# Patient Record
Sex: Female | Born: 1945 | Race: White | Hispanic: No | Marital: Married | Smoking: Former smoker
Health system: Southern US, Community
[De-identification: ages and names within clinical notes are randomized; demographics above are authoritative.]

## PROBLEM LIST (undated history)

## (undated) DIAGNOSIS — I739 Peripheral vascular disease, unspecified: Secondary | ICD-10-CM

## (undated) DIAGNOSIS — R001 Bradycardia, unspecified: Secondary | ICD-10-CM

## (undated) DIAGNOSIS — R9431 Abnormal electrocardiogram [ECG] [EKG]: Secondary | ICD-10-CM

## (undated) DIAGNOSIS — E119 Type 2 diabetes mellitus without complications: Secondary | ICD-10-CM

## (undated) DIAGNOSIS — R0789 Other chest pain: Secondary | ICD-10-CM

## (undated) DIAGNOSIS — R5383 Other fatigue: Secondary | ICD-10-CM

## (undated) DIAGNOSIS — R0602 Shortness of breath: Secondary | ICD-10-CM

## (undated) DIAGNOSIS — R42 Dizziness and giddiness: Secondary | ICD-10-CM

## (undated) DIAGNOSIS — I10 Essential (primary) hypertension: Secondary | ICD-10-CM

## (undated) HISTORY — DX: Essential (primary) hypertension: I10

## (undated) HISTORY — DX: Other fatigue: R53.83

## (undated) HISTORY — PX: BREAST SURGERY: SHX581

## (undated) HISTORY — DX: Shortness of breath: R06.02

## (undated) HISTORY — DX: Abnormal electrocardiogram (ECG) (EKG): R94.31

## (undated) HISTORY — DX: Bradycardia, unspecified: R00.1

## (undated) HISTORY — PX: OVARY SURGERY: SHX727

## (undated) HISTORY — DX: Dizziness and giddiness: R42

## (undated) HISTORY — DX: Type 2 diabetes mellitus without complications: E11.9

## (undated) HISTORY — DX: Other chest pain: R07.89

## (undated) HISTORY — DX: Peripheral vascular disease, unspecified: I73.9

## (undated) HISTORY — PX: MELANOMA EXCISION: SHX5266

---

## 2011-11-01 ENCOUNTER — Ambulatory Visit (INDEPENDENT_AMBULATORY_CARE_PROVIDER_SITE_OTHER): Payer: Medicare Other | Admitting: Cardiology

## 2011-11-01 ENCOUNTER — Encounter: Payer: Self-pay | Admitting: Cardiology

## 2011-11-01 VITALS — BP 132/82 | HR 75 | Ht 66.0 in | Wt 153.0 lb

## 2011-11-01 DIAGNOSIS — I10 Essential (primary) hypertension: Secondary | ICD-10-CM | POA: Insufficient documentation

## 2011-11-01 DIAGNOSIS — E119 Type 2 diabetes mellitus without complications: Secondary | ICD-10-CM | POA: Insufficient documentation

## 2011-11-01 DIAGNOSIS — R42 Dizziness and giddiness: Secondary | ICD-10-CM | POA: Insufficient documentation

## 2011-11-01 DIAGNOSIS — I739 Peripheral vascular disease, unspecified: Secondary | ICD-10-CM

## 2011-11-01 DIAGNOSIS — R9431 Abnormal electrocardiogram [ECG] [EKG]: Secondary | ICD-10-CM

## 2011-11-01 DIAGNOSIS — E785 Hyperlipidemia, unspecified: Secondary | ICD-10-CM

## 2011-11-01 DIAGNOSIS — R0600 Dyspnea, unspecified: Secondary | ICD-10-CM

## 2011-11-01 DIAGNOSIS — R0609 Other forms of dyspnea: Secondary | ICD-10-CM

## 2011-11-01 DIAGNOSIS — R079 Chest pain, unspecified: Secondary | ICD-10-CM

## 2011-11-01 NOTE — Assessment & Plan Note (Signed)
Blood pressure is well controlled even with reduction in her medication. We will continue same therapy.

## 2011-11-01 NOTE — Assessment & Plan Note (Signed)
Patient did try Crestor 2 months ago but quit taking it because it made her feel sick. If she does have evidence of vascular disease either on stress testing or by lower extremity Doppler we may need to reconsider lipid-lowering therapy.

## 2011-11-01 NOTE — Assessment & Plan Note (Signed)
Her symptoms of chest pain are somewhat atypical. Some of her symptoms are more consistent with musculoskeletal pain. She does however have significant cardiac risk factors including diabetes, hypertension, hyperlipidemia and family history of vascular disease. Along with her symptoms of dyspnea have recommended further evaluation with a Lexiscan Myoview study and echocardiogram.

## 2011-11-01 NOTE — Assessment & Plan Note (Signed)
QT interval has improved with reduction in her bradycardia.

## 2011-11-01 NOTE — Patient Instructions (Signed)
We will schedule you for an echocardiogram and a nuclear stress test.  We will arrange doppler testing of your legs to check your circulation.  Continue your current medication

## 2011-11-01 NOTE — Progress Notes (Signed)
Ariana Adams Date of Birth: Mar 13, 1946 Medical Record #[id number]  History of Present Illness: Ariana Adams is seen at the request of Dr. Isabella Stalling for evaluation of dizziness and chest pain. She is a pleasant 66 year old [place] female who has a history of diabetes mellitus type 2, hypertension, and dyslipidemia. She reports that over the past 3 weeks she has been experiencing symptoms of dizziness. She describes this as a sensation that the room is spinning around. It is worse when she lies down in bed. She was noted to have a significant bradycardia and her metoprolol and diltiazem doses were reduced. This has helped a little bit but she continues to have significant dizziness symptoms. In addition she feels weak. She has difficulty catching her breath at times. She complains of chest pain in her mid sternum. This pain hurts with pressure but also may occur with exertion. Her activity is limited by claudication symptoms. She has bilateral calf pain with exertion. She has been on Trental but states she has never had any formal evaluation of her circulation. She did quit smoking in 2009. She denies any prior cardiac history. He has had no prior stress testing or cardiac catheterization. She does have a strong family history of early vascular disease with multiple relatives having strokes.  Current Outpatient Prescriptions on File Prior to Visit  Medication Sig Dispense Refill  . cloNIDine (CATAPRES) 0.3 MG tablet Take 0.3 mg by mouth 2 (two) times daily.       Marland Kitchen diltiazem (CARDIZEM CD) 240 MG 24 hr capsule Take 240 mg by mouth daily.       Marland Kitchen glimepiride (AMARYL) 2 MG tablet Take 2 mg by mouth daily before breakfast.       . hydrochlorothiazide (HYDRODIURIL) 50 MG tablet Take 50 mg by mouth daily.       . metFORMIN (GLUCOPHAGE) 1000 MG tablet Take 1,000 mg by mouth 2 (two) times daily with a meal.       . metoprolol (LOPRESSOR) 100 MG tablet Take 25 mg by mouth Daily.      . metoprolol succinate  (TOPROL-XL) 25 MG 24 hr tablet Take 25 mg by mouth daily.         No Known Allergies  Past Medical History  Diagnosis Date  . Diabetes mellitus   . Dizziness   . Fatigue   . SOB (shortness of breath)     Mild  . Chest pressure   . Bradycardia   . Prolonged QT interval   . Claudication   . HTN (hypertension)     Past Surgical History  Procedure Date  . Ovary surgery   . Melanoma excision     Leg  . Breast surgery     History  Smoking status  . Former Smoker  Smokeless tobacco  . Not on file    History  Alcohol Use No    Family History  Problem Relation Age of Onset  . Stroke Mother   . Cancer Sister   . Stroke Brother     Review of Systems: The review of systems is positive for dizziness. Complaints of chest pain and shortness of breath as noted. Bilateral calf claudication. No history of TIA or stroke. She denies any nausea or vomiting. She has had no syncopal episodes. All other systems were reviewed and are negative.  Physical Exam: BP 132/82  Pulse 75  Ht 5\' 6"  (1.676 m)  Wt 153 lb (69.4 kg)  BMI 24.69 kg/m2  SpO2 97% She is  a pleasant white female in no acute distress. She is normocephalic, atraumatic. Pupils are equal round and reactive to light and accommodation. Sclera are clear. She has no nystagmus. Oropharynx is clear. Neck is supple without JVD, adenopathy, thyromegaly, or bruits. Lungs are clear. Cardiac exam reveals a regular rate and rhythm without gallop, murmur, or click. Abdomen is soft and nontender. There are no masses or bruits. Femoral, popliteal, and dorsalis pedis pulses are 2+ and symmetric. Posterior tibial pulses are difficult to palpate. Skin is warm and dry. She is alert and oriented x3. Cranial nerves II through XII are intact. Gait is normal. LABORATORY DATA: ECG dated 6 10/08/2011 shows marked sinus bradycardia with a rate of 44 beats per minute. QT corrected is 486 ms. There is nonspecific T-wave abnormality. ECG today  demonstrates normal sinus rhythm with a rate of 63 beats per minute. QT corrected is 472 ms. She has nonspecific T-wave abnormality. Laboratory data from 10/08/2011 shows a normal chemistry panel. BNP level was 51. A1c 6.9. TSH 1.765. D-dimer was 0.4. Total cholesterol 204, triglycerides 240, HDL 48, LDL 108.  Assessment / Plan:

## 2011-11-01 NOTE — Assessment & Plan Note (Signed)
Her dizziness as more of a character of true vertigo. She was significantly bradycardic previously but this has resolved with reduction in her metoprolol and diltiazem doses. Despite this her symptoms persist. I suspect this represents an inner ear disturbance or could possibly be related to the posterior circulation issue. She has no carotid bruits. We will await her other cardiac evaluation. If unremarkable she may need ENT evaluation and possible physical therapy for benign positional vertigo.

## 2011-11-01 NOTE — Assessment & Plan Note (Signed)
She does have bilateral calf claudication. She has multiple risk factors for PAD. Pulses are surprisingly good. We will obtain Ariana Adams arterial Doppler studies to further evaluate.

## 2011-11-05 ENCOUNTER — Encounter: Payer: Self-pay | Admitting: Cardiology

## 2011-11-06 ENCOUNTER — Encounter (INDEPENDENT_AMBULATORY_CARE_PROVIDER_SITE_OTHER): Payer: Medicare Other

## 2011-11-06 DIAGNOSIS — R079 Chest pain, unspecified: Secondary | ICD-10-CM

## 2011-11-06 DIAGNOSIS — I70219 Atherosclerosis of native arteries of extremities with intermittent claudication, unspecified extremity: Secondary | ICD-10-CM

## 2011-11-06 DIAGNOSIS — E119 Type 2 diabetes mellitus without complications: Secondary | ICD-10-CM

## 2011-11-06 DIAGNOSIS — R42 Dizziness and giddiness: Secondary | ICD-10-CM

## 2011-11-06 DIAGNOSIS — I739 Peripheral vascular disease, unspecified: Secondary | ICD-10-CM

## 2011-11-07 ENCOUNTER — Ambulatory Visit (HOSPITAL_COMMUNITY): Payer: Medicare Other | Attending: Cardiology

## 2011-11-07 ENCOUNTER — Telehealth: Payer: Self-pay

## 2011-11-07 DIAGNOSIS — I739 Peripheral vascular disease, unspecified: Secondary | ICD-10-CM

## 2011-11-07 DIAGNOSIS — R5383 Other fatigue: Secondary | ICD-10-CM | POA: Insufficient documentation

## 2011-11-07 DIAGNOSIS — E785 Hyperlipidemia, unspecified: Secondary | ICD-10-CM | POA: Insufficient documentation

## 2011-11-07 DIAGNOSIS — R079 Chest pain, unspecified: Secondary | ICD-10-CM

## 2011-11-07 DIAGNOSIS — I517 Cardiomegaly: Secondary | ICD-10-CM | POA: Insufficient documentation

## 2011-11-07 DIAGNOSIS — R5381 Other malaise: Secondary | ICD-10-CM | POA: Insufficient documentation

## 2011-11-07 DIAGNOSIS — I498 Other specified cardiac arrhythmias: Secondary | ICD-10-CM | POA: Insufficient documentation

## 2011-11-07 DIAGNOSIS — R42 Dizziness and giddiness: Secondary | ICD-10-CM | POA: Insufficient documentation

## 2011-11-07 DIAGNOSIS — R0989 Other specified symptoms and signs involving the circulatory and respiratory systems: Secondary | ICD-10-CM | POA: Insufficient documentation

## 2011-11-07 DIAGNOSIS — R072 Precordial pain: Secondary | ICD-10-CM | POA: Insufficient documentation

## 2011-11-07 DIAGNOSIS — R0609 Other forms of dyspnea: Secondary | ICD-10-CM | POA: Insufficient documentation

## 2011-11-07 DIAGNOSIS — I10 Essential (primary) hypertension: Secondary | ICD-10-CM | POA: Insufficient documentation

## 2011-11-07 DIAGNOSIS — E119 Type 2 diabetes mellitus without complications: Secondary | ICD-10-CM | POA: Insufficient documentation

## 2011-11-07 DIAGNOSIS — Z87891 Personal history of nicotine dependence: Secondary | ICD-10-CM | POA: Insufficient documentation

## 2011-11-07 NOTE — Telephone Encounter (Signed)
Patient was called to clarify metoprolol.Metoprolol was entered twice in her chart.Patient states she takes metoprolol succinate 25 mg daily.

## 2011-11-07 NOTE — Progress Notes (Signed)
Echocardiogram performed.  

## 2011-11-13 ENCOUNTER — Ambulatory Visit (HOSPITAL_COMMUNITY): Payer: Medicare Other | Attending: Cardiovascular Disease | Admitting: Radiology

## 2011-11-13 VITALS — BP 148/77 | Ht 66.0 in | Wt 153.0 lb

## 2011-11-13 DIAGNOSIS — R42 Dizziness and giddiness: Secondary | ICD-10-CM | POA: Insufficient documentation

## 2011-11-13 DIAGNOSIS — I739 Peripheral vascular disease, unspecified: Secondary | ICD-10-CM

## 2011-11-13 DIAGNOSIS — E119 Type 2 diabetes mellitus without complications: Secondary | ICD-10-CM | POA: Insufficient documentation

## 2011-11-13 DIAGNOSIS — R0602 Shortness of breath: Secondary | ICD-10-CM

## 2011-11-13 DIAGNOSIS — R079 Chest pain, unspecified: Secondary | ICD-10-CM | POA: Insufficient documentation

## 2011-11-13 MED ORDER — TECHNETIUM TC 99M TETROFOSMIN IV KIT
11.0000 | PACK | Freq: Once | INTRAVENOUS | Status: AC | PRN
  Administered 2011-11-13: 11 via INTRAVENOUS

## 2011-11-13 MED ORDER — REGADENOSON 0.4 MG/5ML IV SOLN
0.4000 mg | Freq: Once | INTRAVENOUS | Status: AC
  Administered 2011-11-13: 0.4 mg via INTRAVENOUS

## 2011-11-13 MED ORDER — TECHNETIUM TC 99M TETROFOSMIN IV KIT
33.0000 | PACK | Freq: Once | INTRAVENOUS | Status: AC | PRN
  Administered 2011-11-13: 33 via INTRAVENOUS

## 2011-11-13 NOTE — Progress Notes (Signed)
[facility] SITE 3 NUCLEAR MED [address] [place] [address] [phone]  Cardiology Nuclear Med Study  Ariana Adams is a 66 y.o. female     MRN : [id number]     DOB: 17-Jan-1946  Procedure Date: 11/13/2011  Nuclear Med Background Indication for Stress Test:  Evaluation for Ischemia History:  n/a Cardiac Risk Factors: Claudication, History of Smoking, Hypertension, Lipids and NIDDM  Symptoms:  Chest Pain, Dizziness, Fatigue and SOB   Nuclear Pre-Procedure Caffeine/Decaff Intake:  None NPO After: 7:30am   Lungs:  clear O2 Sat: 97% on room air. IV 0.9% NS with Angio Cath:  20g  IV Site: R Antecubital  IV Started by:  Stanton Kidney, EMT-P  Chest Size (in):  36 Cup Size: C  Height: 5\' 6"  (1.676 m)  Weight:  153 lb (69.4 kg)  BMI:  Body mass index is 24.69 kg/(m^2). Tech Comments:  Metoprolol held this am, per patient.    Nuclear Med Study 1 or 2 day study: 1 day  Stress Test Type:  Eugenie Birks  Reading MD: Kristeen Miss, MD  Order Authorizing Provider:  P.Jordan MD  Resting Radionuclide: Technetium 86m Tetrofosmin  Resting Radionuclide Dose: 11.0 mCi   Stress Radionuclide:  Technetium 70m Tetrofosmin  Stress Radionuclide Dose: 33.0 mCi           Stress Protocol Rest HR: 64 Stress HR: 108  Rest BP: 148/77 Stress BP: 181/81  Exercise Time (min): n/a METS: n/a   Predicted Max HR: 153 bpm % Max HR: 70.59 bpm Rate Pressure Product: 21308   Dose of Adenosine (mg):  n/a Dose of Lexiscan: 0.4 mg  Dose of Atropine (mg): n/a Dose of Dobutamine: n/a mcg/kg/min (at max HR)  Stress Test Technologist: Milana Na, EMT-P  Nuclear Technologist:  Domenic Polite, CNMT     Rest Procedure:  Myocardial perfusion imaging was performed at rest 45 minutes following the intravenous administration of Technetium 61m Tetrofosmin. Rest ECG: NSR - Normal EKG  Stress Procedure:  The patient received IV Lexiscan 0.4 mg over 15-seconds.  Technetium 109m Tetrofosmin injected  at 30-seconds.  There were no significant changes, + sob, nausea, chest pressure, and headache with Lexiscan.  Quantitative spect images were obtained after a 45 minute delay. Stress ECG: No significant change from baseline ECG  QPS Raw Data Images:  Normal; no motion artifact; normal heart/lung ratio. Stress Images:  Normal homogeneous uptake in all areas of the myocardium. Rest Images:  Normal homogeneous uptake in all areas of the myocardium. Subtraction (SDS):  Normal Transient Ischemic Dilatation (Normal <1.22):  1.13 Lung/Heart Ratio (Normal <0.45):  0.27  Quantitative Gated Spect Images QGS EDV:  82 ml QGS ESV:  31 ml  Impression Exercise Capacity:  Lexiscan with no exercise. BP Response:  Normal blood pressure response. Clinical Symptoms:  No significant symptoms noted. ECG Impression:  No significant ST segment change suggestive of ischemia. Comparison with Prior Nuclear Study: No images to compare  Overall Impression:  Normal stress nuclear study. No evidence of ischemia.  Normal LV systolic function.   LV Ejection Fraction: 62%.  LV Wall Motion:  NL LV Function; NL Wall Motion    Vesta Mixer, Montez Hageman., MD, [facility] 11/13/2011, 4:30 PM Office - [phone] Pager [phone]

## 2011-11-14 ENCOUNTER — Encounter: Payer: Self-pay | Admitting: Cardiovascular Disease

## 2011-11-14 ENCOUNTER — Ambulatory Visit (INDEPENDENT_AMBULATORY_CARE_PROVIDER_SITE_OTHER): Payer: Medicare Other | Admitting: Cardiovascular Disease

## 2011-11-14 VITALS — BP 158/84 | HR 73 | Ht 66.0 in | Wt 154.0 lb

## 2011-11-14 DIAGNOSIS — I739 Peripheral vascular disease, unspecified: Secondary | ICD-10-CM

## 2011-11-14 MED ORDER — CILOSTAZOL 100 MG PO TABS: 100.0000 mg | ORAL_TABLET | Freq: Two times a day (BID) | ORAL | Status: DC

## 2011-11-14 NOTE — Patient Instructions (Addendum)
Your physician wants you to follow-up in: 6 months.   You will receive a reminder letter in the mail two months in advance. If you don't receive a letter, please call our office to schedule the follow-up appointment.   Your physician has recommended you make the following change in your medication: STOP trental, START Pletal 100mg  twice daily

## 2011-11-15 NOTE — Progress Notes (Signed)
HPI  Ariana Adams is seen at the request of Dr. Swaziland for evaluation of claudication and peripheral arterial disease.  She is a pleasant 66 year old [place] female who has a history of diabetes mellitus type 2, hypertension, and dyslipidemia. She was seen recently by Dr. Swaziland for evaluation of dizziness and atypical chest pain. She underwent cardiac evaluation that included an echocardiogram and stress test. Both of them were unremarkable. The patient has known about peripheral arterial disease for many years. She was told about that when she was in [place] and was prescribed Trental without any improvement. She describes bilateral calf claudication worse on the left side that happens after walking one block. She usually has to rest for about 5 or 10 minutes before she can resume. She has others discomfort in her back radiating down to her legs that seems to be neuropathic in nature. She has no history of nonhealing ulcers. She is a previous smoker and quit in 2009.  No Known Allergies   Current Outpatient Prescriptions on File Prior to Visit  Medication Sig Dispense Refill  . aspirin 81 MG tablet Take 81 mg by mouth daily.      . cloNIDine (CATAPRES) 0.3 MG tablet Take 0.3 mg by mouth 2 (two) times daily.       Marland Kitchen diltiazem (CARDIZEM CD) 240 MG 24 hr capsule Take 240 mg by mouth daily.       Marland Kitchen glimepiride (AMARYL) 2 MG tablet Take 2 mg by mouth daily before breakfast.       . hydrochlorothiazide (HYDRODIURIL) 50 MG tablet Take 50 mg by mouth daily.       . metFORMIN (GLUCOPHAGE) 1000 MG tablet Take 1,000 mg by mouth 2 (two) times daily with a meal.       . metoprolol succinate (TOPROL-XL) 25 MG 24 hr tablet Take 25 mg by mouth daily.          Past Medical History  Diagnosis Date  . Diabetes mellitus   . Dizziness   . Fatigue   . SOB (shortness of breath)     Mild  . Chest pressure   . Bradycardia   . Prolonged QT interval   . Claudication   . HTN (hypertension)      Past  Surgical History  Procedure Date  . Ovary surgery   . Melanoma excision     Leg  . Breast surgery      Family History  Problem Relation Age of Onset  . Stroke Mother   . Cancer Sister   . Stroke Brother      History   Social History  . Marital Status: Married    Spouse Name: N/A    Number of Children: 2  . Years of Education: N/A   Occupational History  . Not on file.   Social History Main Topics  . Smoking status: Former Games developer  . Smokeless tobacco: Never Used  . Alcohol Use: No  . Drug Use: No  . Sexually Active: Not on file   Other Topics Concern  . Not on file   Social History Narrative  . No narrative on file     PHYSICAL EXAM   BP 158/84  Pulse 73  Ht 5\' 6"  (1.676 m)  Wt 154 lb (69.854 kg)  BMI 24.86 kg/m2  Constitutional: She is oriented to person, place, and time. She appears well-developed and well-nourished. No distress.  HENT: No nasal discharge.  Head: Normocephalic and atraumatic.  Eyes: Pupils  are equal and round. Right eye exhibits no discharge. Left eye exhibits no discharge.  Neck: Normal range of motion. Neck supple. No JVD present. No thyromegaly present.  Cardiovascular: Normal rate, regular rhythm, normal heart sounds. Exam reveals no gallop and no friction rub. 1/6 systolic ejection murmur at the aortic area. Pulmonary/Chest: Effort normal and breath sounds normal. No stridor. No respiratory distress. She has no wheezes. She has no rales. She exhibits no tenderness.  Abdominal: Soft. Bowel sounds are normal. She exhibits no distension. There is no tenderness. There is no rebound and no guarding.  Musculoskeletal: Normal range of motion. She exhibits no edema and no tenderness.  Neurological: She is alert and oriented to person, place, and time. Coordination normal.  Skin: Skin is warm and dry. No rash noted. She is not diaphoretic. No erythema. No pallor.  Psychiatric: She has a normal mood and affect. Her behavior is normal.  Judgment and thought content normal.  Vascular: Femoral pulses are normal bilaterally. Posterior tibial/dorsalis pedis: Very faint on the right side and not palpable on the left side.    ASSESSMENT AND PLAN

## 2011-11-15 NOTE — Assessment & Plan Note (Signed)
The patient has bilateral calf claudication worse on the left side. ABI was moderately reduced on the left side at 0.61 and mildly reduced on the right side at 0.87. Arterial duplex showed significant stenosis in the right mid SFA as well as a long 13 cm length total occlusion of the proximal left SFA and reconstitution with collaterals. Toe pressures were reduced but were adequate. The stenosis in the right side is likely easily approachable by endovascular approach. However, there is a long total occlusion on the left side with likely low chance of success in crossing. Also the long-term patency is low with such a long lesion. Surgical revascularization is not warranted at this time given that her symptoms are not severe there is no evidence of critical limb ischemia. Thus, I recommend starting medical therapy and a walking program to improve her symptoms. I will stop Trental and start Pletal 100 mg twice daily. I gave her the proper instructions on a walking program. Continue aspirin daily and aggressive control of risk factors. I highly suggest that a statin he started  given that she has diabetes and peripheral arterial disease with a target LDL of less than 70. Followup in 6 months to reevaluate her symptoms.

## 2011-11-27 ENCOUNTER — Encounter: Payer: Self-pay | Admitting: Nurse Practitioner

## 2011-11-27 ENCOUNTER — Ambulatory Visit (INDEPENDENT_AMBULATORY_CARE_PROVIDER_SITE_OTHER): Payer: Medicare Other | Admitting: Nurse Practitioner

## 2011-11-27 VITALS — BP 168/94 | HR 78 | Ht 66.0 in | Wt 154.1 lb

## 2011-11-27 DIAGNOSIS — I739 Peripheral vascular disease, unspecified: Secondary | ICD-10-CM

## 2011-11-27 DIAGNOSIS — R079 Chest pain, unspecified: Secondary | ICD-10-CM

## 2011-11-27 DIAGNOSIS — E785 Hyperlipidemia, unspecified: Secondary | ICD-10-CM

## 2011-11-27 DIAGNOSIS — I10 Essential (primary) hypertension: Secondary | ICD-10-CM

## 2011-11-27 DIAGNOSIS — R9431 Abnormal electrocardiogram [ECG] [EKG]: Secondary | ICD-10-CM

## 2011-11-27 MED ORDER — ATORVASTATIN CALCIUM 10 MG PO TABS: 5.0000 mg | ORAL_TABLET | Freq: Every day | ORAL | Status: DC

## 2011-11-27 MED ORDER — HYDRALAZINE HCL 25 MG PO TABS: 25.0000 mg | ORAL_TABLET | Freq: Three times a day (TID) | ORAL | Status: DC

## 2011-11-27 NOTE — Assessment & Plan Note (Signed)
She has known PVD. Did not tolerate the Pletal. I have discontinued this today. Walking is encouraged and I explained that she will have to rest frequently. Statin is added. Could consider adding back Pletal on return.

## 2011-11-27 NOTE — Progress Notes (Signed)
Ellina Coatney Date of Birth: 08/26/1945 Medical Record #[id number]  History of Present Illness: Ms. Allemand is seen today for a follow up visit. She is seen for Dr. Swaziland. She has no known CAD but multiple risk factors which include DM, HTN and HLD. She also has PVD. She has had past tobacco abuse but stopped back in 2009. She had prolonged QT that was aggravated by bradycardia which improved with reduction in her beta blocker and Diltiazem. She has seen Dr. Kirke Corin for her PVD and he switched her over to Pletal from Trental. He felt like surgical intervention was not warranted at this time given that her symptoms were not that severe and that walking, statin therapy,  Pletal, and aggressive risk factor modification was advised. She has just had a basically normal echo and stress Myoview study.   She comes in today. She is here with her daughter. She says she just "feels sick". She says the Pletal made her sick, made her blood pressure go up and made her chest hurt worse. She stopped it on Sunday and feels a little better. BP is still up. Seems to not be having as much dizziness. She is not walking. Not on a statin. Apparently did not tolerate Crestor in the past and I suspect she is pretty sensitive to medicines in general. Her legs still hurt when she walks. No ulcers on her feet reported.   Current Outpatient Prescriptions on File Prior to Visit  Medication Sig Dispense Refill  . aspirin 81 MG tablet Take 81 mg by mouth daily.      . cloNIDine (CATAPRES) 0.3 MG tablet Take 0.3 mg by mouth 2 (two) times daily.       Marland Kitchen diltiazem (CARDIZEM CD) 240 MG 24 hr capsule Take 240 mg by mouth daily.       Marland Kitchen glimepiride (AMARYL) 2 MG tablet Take 2 mg by mouth daily before breakfast.       . hydrochlorothiazide (HYDRODIURIL) 50 MG tablet Take 50 mg by mouth daily.       . metFORMIN (GLUCOPHAGE) 1000 MG tablet Take 1,000 mg by mouth 2 (two) times daily with a meal.       . metoprolol succinate (TOPROL-XL) 25 MG  24 hr tablet Take 25 mg by mouth daily.       Marland Kitchen atorvastatin (LIPITOR) 10 MG tablet Take 0.5 tablets (5 mg total) by mouth daily.  15 tablet  6  . hydrALAZINE (APRESOLINE) 25 MG tablet Take 1 tablet (25 mg total) by mouth 3 (three) times daily.  90 tablet  3    No Known Allergies  Past Medical History  Diagnosis Date  . Diabetes mellitus   . Dizziness   . Fatigue   . SOB (shortness of breath)     Mild  . Chest pressure   . Bradycardia   . Prolonged QT interval   . Claudication   . HTN (hypertension)     Past Surgical History  Procedure Date  . Ovary surgery   . Melanoma excision     Leg  . Breast surgery     History  Smoking status  . Former Smoker  Smokeless tobacco  . Never Used    History  Alcohol Use No    Family History  Problem Relation Age of Onset  . Stroke Mother   . Cancer Sister   . Stroke Brother     Review of Systems: The review of systems is per the HPI.  All  other systems were reviewed and are negative.  Physical Exam: BP 168/94  Pulse 78  Ht 5\' 6"  (1.676 m)  Wt 154 lb 1.9 oz (69.908 kg)  BMI 24.88 kg/m2 Patient is very pleasant and in no acute distress. Skin is warm and dry. Color is normal.  HEENT is unremarkable. Normocephalic/atraumatic. PERRL. Sclera are nonicteric. Neck is supple. No masses. No JVD. Lungs are clear. Cardiac exam shows a regular rate and rhythm. Abdomen is soft. Extremities are without edema. She actually has palpable pulses noted. Gait and ROM are intact. No gross neurologic deficits noted.   LABORATORY DATA: EKG shows sinus rhythm. Her QT is down to 420 with a QTc of 462.  Echo Study Conclusions July 2013  - Left ventricle: The cavity size was normal. Wall thickness was increased in a pattern of moderate LVH. Systolic function was low normal to mildly reduced. The estimated ejection fraction was in the range of 50% to 55%. Wall motion was normal; there were no regional wall motion abnormalities. Doppler  parameters are consistent with abnormal left ventricular relaxation (grade 1 diastolic dysfunction). - Aortic valve: There was no stenosis. - Mitral valve: Trivial regurgitation. - Left atrium: The atrium was mildly dilated. - Right ventricle: The cavity size was normal. Systolic function was normal. - Pulmonary arteries: No complete TR doppler jet so unable to estimate PA systolic pressure. - Inferior vena cava: The vessel was normal in size; the respirophasic diameter changes were in the normal range (= 50%); findings are consistent with normal central venous pressure.  Myoview Overall Impression:  Normal stress nuclear study. No evidence of ischemia. Normal LV systolic function.  LV Ejection Fraction: 62%. LV Wall Motion: NL LV Function; NL Wall Motion   Vesta Mixer, Montez Hageman., MD, [facility]  11/13/2011, 4:30 PM   Assessment / Plan:

## 2011-11-27 NOTE — Assessment & Plan Note (Signed)
She doesn't remember trying the Crestor. I have added low dose Lipitor at just 5 mg a day. Will check fasting labs on her return visit.

## 2011-11-27 NOTE — Patient Instructions (Signed)
I want to see you in about a month with fasting labs  Monitor your blood pressure at home and keep a diary  Avoid salt  Try to walk daily - you will have to stop and rest  We are adding Hydralazine 25 mg three times a day for your blood pressure  Stay off the Pletal  We are also adding Lipitor 10 mg to take just a half (5mg ) at night  Call the [facility] Care office at [phone] if you have any questions, problems or concerns.

## 2011-11-27 NOTE — Assessment & Plan Note (Signed)
Blood pressure remains high. She is not able to take higher doses of her BB or CCB due to bradycardia and worsening QT. I have added Hydralazine 25 mg TID. I will see her in a month. She is to bring her cuff in for correlation.

## 2011-11-27 NOTE — Assessment & Plan Note (Signed)
Her Myoview looks good. Need to work on risk factor modification.

## 2011-11-27 NOTE — Assessment & Plan Note (Signed)
This has improved.

## 2011-12-28 ENCOUNTER — Encounter: Payer: Self-pay | Admitting: Nurse Practitioner

## 2011-12-28 ENCOUNTER — Ambulatory Visit
Admission: RE | Admit: 2011-12-28 | Discharge: 2011-12-28 | Disposition: A | Discharge status: Home / Self Care | Payer: Medicare Other | Source: Ambulatory Visit | Attending: Nurse Practitioner | Admitting: Nurse Practitioner

## 2011-12-28 ENCOUNTER — Ambulatory Visit (INDEPENDENT_AMBULATORY_CARE_PROVIDER_SITE_OTHER): Payer: Medicare Other | Admitting: Nurse Practitioner

## 2011-12-28 ENCOUNTER — Other Ambulatory Visit: Payer: Self-pay | Admitting: Nurse Practitioner

## 2011-12-28 VITALS — BP 132/70 | HR 60 | Ht 65.0 in | Wt 151.8 lb

## 2011-12-28 DIAGNOSIS — R079 Chest pain, unspecified: Secondary | ICD-10-CM

## 2011-12-28 DIAGNOSIS — Z01810 Encounter for preprocedural cardiovascular examination: Secondary | ICD-10-CM

## 2011-12-28 LAB — CBC WITH DIFFERENTIAL/PLATELET
Basophils Absolute: 0 10*3/uL (ref 0.0–0.1)
Basophils Relative: 0.4 % (ref 0.0–3.0)
Eosinophils Absolute: 0.1 10*3/uL (ref 0.0–0.7)
Eosinophils Relative: 1.1 % (ref 0.0–5.0)
HCT: 36 % (ref 36.0–46.0)
Hemoglobin: 11.9 g/dL — ABNORMAL LOW (ref 12.0–15.0)
Lymphocytes Relative: 26.5 % (ref 12.0–46.0)
Lymphs Abs: 2.8 10*3/uL (ref 0.7–4.0)
MCHC: 33.1 g/dL (ref 30.0–36.0)
MCV: 88.1 fl (ref 78.0–100.0)
Monocytes Absolute: 0.6 10*3/uL (ref 0.1–1.0)
Monocytes Relative: 5.8 % (ref 3.0–12.0)
Neutro Abs: 7 10*3/uL (ref 1.4–7.7)
Neutrophils Relative %: 66.2 % (ref 43.0–77.0)
Platelets: 380 10*3/uL (ref 150.0–400.0)
RBC: 4.09 Mil/uL (ref 3.87–5.11)
RDW: 13.6 % (ref 11.5–14.6)
WBC: 10.6 10*3/uL — ABNORMAL HIGH (ref 4.5–10.5)

## 2011-12-28 LAB — BASIC METABOLIC PANEL
BUN: 20 mg/dL (ref 6–23)
CO2: 29 mEq/L (ref 19–32)
Calcium: 9.7 mg/dL (ref 8.4–10.5)
Chloride: 100 mEq/L (ref 96–112)
Creatinine, Ser: 1.1 mg/dL (ref 0.4–1.2)
GFR: 51.47 mL/min — ABNORMAL LOW (ref >60.00)
Glucose, Bld: 140 mg/dL — ABNORMAL HIGH (ref 70–99)
Potassium: 3.6 mEq/L (ref 3.5–5.1)
Sodium: 139 mEq/L (ref 135–145)

## 2011-12-28 LAB — APTT: aPTT: 31.5 s — ABNORMAL HIGH (ref 21.7–28.8)

## 2011-12-28 LAB — PROTIME-INR
INR: 1 ratio (ref 0.8–1.0)
Prothrombin Time: 11.1 s (ref 10.2–12.4)

## 2011-12-28 MED ORDER — NITROGLYCERIN 0.4 MG SL SUBL: 0.4000 mg | SUBLINGUAL_TABLET | SUBLINGUAL | Status: AC | PRN

## 2011-12-28 NOTE — Progress Notes (Signed)
 Ariana Adams Date of Birth: 10/21/1945 Medical Record #[id number]  History of Present Illness: Ms. Ariana Adams is seen back today for a one month check. She is seen for Dr. Jordan. She has no known CAD but multiple risk factors which include DM, HTN and HLD. She also has PVD. She has had past tobacco abuse but stopped back in 2009. She had prolonged QT that was aggravated by bradycardia which improved with reduction in her beta blocker and Diltiazem. She has seen Dr. Arida for her PVD and he switched her over to Pletal from Trental. He felt like surgical intervention was not warranted at this time given that her symptoms were not that severe and that walking, statin therapy, Pletal, and aggressive risk factor modification was advised. She has had a basically normal echo and stress Myoview study back in July. She has never had a heart catheterization. She did not tolerate the Pletal. We added low dose Lipitor for her lipids and Hydralazine for her BP at her last visit.   She comes in today. She is here with her husband. She is not feeling well. She is still having chest pain. She is having palpitations. Her chest pain is occurring with and without exertion. She will have associated dyspnea. Does not have NTG on hand. Mostly at night. She is frustrated and aggravated. She is only taking her Hydralazine and Lipitor "sometimes". She says her blood pressure is ok at home. Still having claudication.   Current Outpatient Prescriptions on File Prior to Visit  Medication Sig Dispense Refill  . aspirin 81 MG tablet Take 81 mg by mouth daily.      . atorvastatin (LIPITOR) 10 MG tablet Take 0.5 tablets (5 mg total) by mouth daily.  15 tablet  6  . cloNIDine (CATAPRES) 0.3 MG tablet Take 0.3 mg by mouth 2 (two) times daily.       . diltiazem (CARDIZEM CD) 240 MG 24 hr capsule Take 240 mg by mouth daily.       . glimepiride (AMARYL) 2 MG tablet Take 2 mg by mouth daily before breakfast.       . hydrALAZINE  (APRESOLINE) 25 MG tablet Take 1 tablet (25 mg total) by mouth 3 (three) times daily.  90 tablet  3  . hydrochlorothiazide (HYDRODIURIL) 50 MG tablet Take 50 mg by mouth daily.       . metFORMIN (GLUCOPHAGE) 1000 MG tablet Take 1,000 mg by mouth 2 (two) times daily with a meal.       . metoprolol succinate (TOPROL-XL) 25 MG 24 hr tablet Take 25 mg by mouth daily.       . nitroGLYCERIN (NITROSTAT) 0.4 MG SL tablet Place 1 tablet (0.4 mg total) under the tongue every 5 (five) minutes as needed for chest pain.  25 tablet  3    No Known Allergies  Past Medical History  Diagnosis Date  . Diabetes mellitus   . Dizziness   . Fatigue   . SOB (shortness of breath)     Mild  . Chest pressure   . Bradycardia   . Prolonged QT interval   . Claudication   . HTN (hypertension)     Past Surgical History  Procedure Date  . Ovary surgery   . Melanoma excision     Leg  . Breast surgery     History  Smoking status  . Former Smoker  Smokeless tobacco  . Never Used    History  Alcohol Use No      Family History  Problem Relation Age of Onset  . Stroke Mother   . Cancer Sister   . Stroke Brother     Review of Systems: The review of systems is per the HPI.  All other systems were reviewed and are negative.  Physical Exam: BP 132/70  Pulse 60  Ht 5' 5" (1.651 m)  Wt 151 lb 12 oz (68.833 kg)  BMI 25.25 kg/m2 Patient is very pleasant and in no acute distress. She is [place]. Skin is warm and dry. Color is normal.  HEENT is unremarkable. Normocephalic/atraumatic. PERRL. Sclera are nonicteric. Neck is supple. No masses. No JVD. Lungs are clear. Cardiac exam shows a regular rate and rhythm. Abdomen is soft. Extremities are without edema. Gait and ROM are intact. No gross neurologic deficits noted.  LABORATORY DATA: PENDING FOR TODAY  Assessment / Plan:  1. HTN - with reported satisfactory BP at home. She is not taking her Hydralazine as prescribed and only taking occasionally. She  says she is taking her other medicines regularly.   2. HLD - now on low dose Lipitor but only taking occasionally as well.   3. Claudication - intolerant to Pletal - her symptoms seem unchanged.   4. Chest pain/palpitations/dyspnea - She does have multiple CV risk factors. She remains symptomatic. Despite her negative Myoview, we are going to proceed on with cardiac catheterization to further define. This procedure has been reviewed in full detail and she is willing to proceed. NTG is also prescribed today with full instruction. Her cath is set up for next Friday with Dr. Jordan. She is to hold her Metformin after Thursday. Will check labs today and send her for a CXR.   Patient is agreeable to this plan and will call if any problems develop in the interim.   

## 2011-12-28 NOTE — Patient Instructions (Addendum)
We are going to arrange for a heart catheterization  We need to check labs today  We need to get a chest Xray today. Go to [employer] to the first floor to [employer]. You may walk in  You are scheduled for an outpatient cardiac catheterization on Friday, September 13th with Dr. Swaziland or associates.  Go the Heart & Vascular Center at [facility] on Friday, September 13th at 8:30am.  Call the Heart & Vascular Center at [phone] if you are unable to make your appointment.  The code to get into the parking garage under the building is 0080. Then go to the first floor.  You must have someone available to drive you home. Someone needs to be with you for the first 24 hours after you arrive home. Please wear clothes that are easy to get on and off.  Do not eat or drink after midnight on next Thursday, September 12th. You may have water only with your medications on the morning of your procedure.   May sure you take your aspirin on the day of your procedure.   Stop your Metformin after next Thursday's (September 12th) dose     Directions to the Outpatient Cardiac Cath Lab at [facility]:  Please Note:  Park in [place] under the building not the parking deck.  From [employer]: Turn onto [address] Left onto [address] (1st stoplight) Right at the brick entrance to the hospital ([address] circle [address]) Bear to the right and you will see a blue sign "Heart and Vascular Center" Parking garage is a sharp right, to get through the gate put in the code 0080. Once you park, take the elevator to the first floor.  Please do not arrive before 6:30 am.  The building will be dark before that time.  From [employer]: Turn onto [address] Turn left into the brick entrance to the hospital (Main circle drive) [place] to the right and you will see a blue sign "[facility] V[facility] Center" Parking garage is a sharp right, to get through the gate put in the code 0080. Once you park,  take the elevator to the first floor.  Please do not arrive before 6:30 am.  The building will be dark before that time.    Coronary Angiography Coronary angiography is an X-ray procedure used to look at the arteries in the heart. In this procedure, a dye is injected through a long, hollow tube (catheter). The catheter is about the size of a piece of cooked spaghetti. The catheter injects a dye into an artery in your groin. X-rays are then taken to show if there is a blockage in the arteries of your heart. BEFORE THE PROCEDURE   Let your caregiver know if you have allergies to shellfish or contrast dye. Also let your caregiver know if you have kidney problems or failure.   Do not eat or drink starting from midnight up to the time of the procedure, or as directed.   You may drink enough water to take your medications the morning of the procedure if you were instructed to do so.   You should be at the hospital or outpatient facility where the procedure is to be done 60 minutes prior to the procedure or as directed.  PROCEDURE  You may be given an IV medication to help you relax before the procedure.   You will be prepared for the procedure by washing and shaving the area where the catheter will be inserted. This  is usually done in the groin but may be done in the fold of your arm by your elbow.   A medicine will be given to numb your groin where the catheter will be inserted.   A specially trained doctor will insert the catheter into an artery in your groin. The catheter is guided by using a special type of X-ray (fluoroscopy) to the blood vessel being examined.   A special dye is then injected into the catheter and X-rays are taken. The dye helps to show where any narrowing or blockages are located in the heart arteries.  AFTER THE PROCEDURE   After the procedure you will be kept in bed lying flat for several hours. You will be instructed to not bend or cross your legs.   The groin  insertion site will be watched and checked frequently.   The pulse in your feet will be checked frequently.   Additional blood tests, X-rays and an EKG may be done.   You may stay in the hospital overnight for observation.  SEEK IMMEDIATE MEDICAL CARE IF:   You develop chest pain, shortness of breath, feel faint, or pass out.   There is bleeding, swelling, or drainage from the catheter insertion site.   You develop pain, discoloration, coldness, or severe bruising in the leg or area where the catheter was inserted.   You have a fever.  Document Released: 10/14/2002 Document Revised: 03/29/2011 Document Reviewed: 12/03/2007 [facility] Patient Information 2012 [place], [place].

## 2011-12-31 ENCOUNTER — Telehealth: Payer: Self-pay | Admitting: *Deleted

## 2011-12-31 NOTE — Telephone Encounter (Signed)
Message copied by Awilda Bill on Mon Dec 31, 2011  3:53 PM ------      Message from: Rosalio Macadamia      Created: Sat Dec 29, 2011 10:04 AM       Ok to report. Labs are satisfactory. For cardiac cath on Friday.

## 2012-01-04 ENCOUNTER — Encounter (HOSPITAL_BASED_OUTPATIENT_CLINIC_OR_DEPARTMENT_OTHER): Payer: Self-pay

## 2012-01-04 ENCOUNTER — Inpatient Hospital Stay (HOSPITAL_BASED_OUTPATIENT_CLINIC_OR_DEPARTMENT_OTHER)
Admission: RE | Admit: 2012-01-04 | Discharge: 2012-01-04 | Disposition: A | Discharge status: Home / Self Care | Payer: Medicare Other | Source: Ambulatory Visit | Attending: Cardiology | Admitting: Cardiology

## 2012-01-04 ENCOUNTER — Encounter (HOSPITAL_BASED_OUTPATIENT_CLINIC_OR_DEPARTMENT_OTHER)
Admission: RE | Disposition: A | Discharge status: Home / Self Care | Payer: Self-pay | Source: Ambulatory Visit | Attending: Cardiology

## 2012-01-04 DIAGNOSIS — I10 Essential (primary) hypertension: Secondary | ICD-10-CM | POA: Insufficient documentation

## 2012-01-04 DIAGNOSIS — Z01810 Encounter for preprocedural cardiovascular examination: Secondary | ICD-10-CM

## 2012-01-04 DIAGNOSIS — R079 Chest pain, unspecified: Secondary | ICD-10-CM | POA: Insufficient documentation

## 2012-01-04 DIAGNOSIS — E119 Type 2 diabetes mellitus without complications: Secondary | ICD-10-CM | POA: Insufficient documentation

## 2012-01-04 DIAGNOSIS — E785 Hyperlipidemia, unspecified: Secondary | ICD-10-CM | POA: Insufficient documentation

## 2012-01-04 DIAGNOSIS — I739 Peripheral vascular disease, unspecified: Secondary | ICD-10-CM | POA: Insufficient documentation

## 2012-01-04 LAB — POCT I-STAT GLUCOSE
Glucose, Bld: 168 mg/dL — ABNORMAL HIGH (ref 70–99)
Operator id: 221371

## 2012-01-04 SURGERY — JV LEFT HEART CATHETERIZATION WITH CORONARY ANGIOGRAM
Anesthesia: Moderate Sedation

## 2012-01-04 MED ORDER — DIAZEPAM 5 MG PO TABS
10.0000 mg | ORAL_TABLET | ORAL | Status: AC
  Administered 2012-01-04: 10 mg via ORAL

## 2012-01-04 MED ORDER — SODIUM CHLORIDE 0.9 % IJ SOLN: 3.0000 mL | INTRAMUSCULAR | Status: DC | PRN

## 2012-01-04 MED ORDER — SODIUM CHLORIDE 0.9 % IV SOLN: 1.0000 mL/kg/h | INTRAVENOUS | Status: DC

## 2012-01-04 MED ORDER — SODIUM CHLORIDE 0.9 % IV SOLN
250.0000 mL | INTRAVENOUS | Status: DC | PRN
Start: 2012-01-04 — End: 2012-01-04

## 2012-01-04 MED ORDER — SODIUM CHLORIDE 0.9 % IJ SOLN: 3.0000 mL | Freq: Two times a day (BID) | INTRAMUSCULAR | Status: DC

## 2012-01-04 MED ORDER — ASPIRIN 81 MG PO CHEW
324.0000 mg | CHEWABLE_TABLET | ORAL | Status: AC
  Administered 2012-01-04: 324 mg via ORAL

## 2012-01-04 MED ORDER — METFORMIN HCL 500 MG PO TABS: 1000.0000 mg | ORAL_TABLET | Freq: Two times a day (BID) | ORAL | Status: DC

## 2012-01-04 MED ORDER — ONDANSETRON HCL 4 MG/2ML IJ SOLN: 4.0000 mg | Freq: Four times a day (QID) | INTRAMUSCULAR | Status: DC | PRN

## 2012-01-04 MED ORDER — SODIUM CHLORIDE 0.9 % IV SOLN
INTRAVENOUS | Status: DC
  Administered 2012-01-04: 09:00:00 via INTRAVENOUS

## 2012-01-04 MED ORDER — ACETAMINOPHEN 325 MG PO TABS: 650.0000 mg | ORAL_TABLET | ORAL | Status: DC | PRN

## 2012-01-04 NOTE — CV Procedure (Signed)
   Cardiac Catheterization Procedure Note  Name: Ariana Adams MRN: [id number] DOB: 1945-08-13  Procedure: Left Heart Cath, Selective Coronary Angiography, LV angiography  Indication: 66 year old white female with history of diabetes, hyperlipidemia, hypertension, and peripheral arterial disease presents with symptoms of refractory chest pain. Prior echocardiogram and nuclear stress tests were normal.   Procedural details: The right groin was prepped, draped, and anesthetized with 1% lidocaine. Using modified Seldinger technique, a 4 French sheath was introduced into the right femoral artery. Standard Judkins catheters were used for coronary angiography and left ventriculography. Catheter exchanges were performed over a guidewire. There were no immediate procedural complications. The patient was transferred to the post catheterization recovery area for further monitoring.  Procedural Findings: Hemodynamics:  AO 140/59 with a mean of 89 mmHg LV 140/14 mmHg   Coronary angiography: Coronary dominance: right  Left mainstem: Normal  Left anterior descending (LAD): The LAD is relatively small in caliber and doesn't extend all the way to the apex. It gives rise to 2 diagonal branches. These vessels are all normal.  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 His resting single marginal branch. There is a 20% stenosis.  Right coronary artery (RCA): The right coronary is a large dominant vessel. It is very tortuous. The PDA supplies blood to the apex. There are mild calcifications with irregularities less than 10%.  Left ventriculography: Left ventricular systolic function is normal, LVEF is estimated at 55-65%, there is no significant mitral regurgitation   The thoracic aorta is diffusely and moderately enlarged and calcified.  Final Conclusions:  1. No significant coronary disease. 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 2. Normal left ventricular function. 3. Diffuse thoracic aortic enlargement.  Recommendations: Consider chest CT with contrast to assess aortic dimension  Theron Arista [facility] 01/04/2012, 10:26 AM

## 2012-01-04 NOTE — Interval H&P Note (Signed)
History and Physical Interval Note:  01/04/2012 9:59 AM  Ariana Adams  has presented today for surgery, with the diagnosis of cp  The various methods of treatment have been discussed with the patient and family. After consideration of risks, benefits and other options for treatment, the patient has consented to  Procedure(s) (LRB) with comments: JV LEFT HEART CATHETERIZATION WITH CORONARY ANGIOGRAM (N/A) as a surgical intervention .  The patient's history has been reviewed, patient examined, no change in status, stable for surgery.  I have reviewed the patient's chart and labs.  Questions were answered to the patient's satisfaction.     Theron Arista [facility] 01/04/2012 9:59 AM

## 2012-01-04 NOTE — H&P (View-Only) (Signed)
Ariana Adams Date of Birth: 16-Sep-1945 Medical Record #[id number]  History of Present Illness: Ms. Pita is seen back today for a one month check. She is seen for Dr. Swaziland. She has no known CAD but multiple risk factors which include DM, HTN and HLD. She also has PVD. She has had past tobacco abuse but stopped back in 2009. She had prolonged QT that was aggravated by bradycardia which improved with reduction in her beta blocker and Diltiazem. She has seen Dr. Kirke Corin for her PVD and he switched her over to Pletal from Trental. He felt like surgical intervention was not warranted at this time given that her symptoms were not that severe and that walking, statin therapy, Pletal, and aggressive risk factor modification was advised. She has had a basically normal echo and stress Myoview study back in July. She has never had a heart catheterization. She did not tolerate the Pletal. We added low dose Lipitor for her lipids and Hydralazine for her BP at her last visit.   She comes in today. She is here with her husband. She is not feeling well. She is still having chest pain. She is having palpitations. Her chest pain is occurring with and without exertion. She will have associated dyspnea. Does not have NTG on hand. Mostly at night. She is frustrated and aggravated. She is only taking her Hydralazine and Lipitor "sometimes". She says her blood pressure is ok at home. Still having claudication.   Current Outpatient Prescriptions on File Prior to Visit  Medication Sig Dispense Refill  . aspirin 81 MG tablet Take 81 mg by mouth daily.      Marland Kitchen atorvastatin (LIPITOR) 10 MG tablet Take 0.5 tablets (5 mg total) by mouth daily.  15 tablet  6  . cloNIDine (CATAPRES) 0.3 MG tablet Take 0.3 mg by mouth 2 (two) times daily.       Marland Kitchen diltiazem (CARDIZEM CD) 240 MG 24 hr capsule Take 240 mg by mouth daily.       Marland Kitchen glimepiride (AMARYL) 2 MG tablet Take 2 mg by mouth daily before breakfast.       . hydrALAZINE  (APRESOLINE) 25 MG tablet Take 1 tablet (25 mg total) by mouth 3 (three) times daily.  90 tablet  3  . hydrochlorothiazide (HYDRODIURIL) 50 MG tablet Take 50 mg by mouth daily.       . metFORMIN (GLUCOPHAGE) 1000 MG tablet Take 1,000 mg by mouth 2 (two) times daily with a meal.       . metoprolol succinate (TOPROL-XL) 25 MG 24 hr tablet Take 25 mg by mouth daily.       . nitroGLYCERIN (NITROSTAT) 0.4 MG SL tablet Place 1 tablet (0.4 mg total) under the tongue every 5 (five) minutes as needed for chest pain.  25 tablet  3    No Known Allergies  Past Medical History  Diagnosis Date  . Diabetes mellitus   . Dizziness   . Fatigue   . SOB (shortness of breath)     Mild  . Chest pressure   . Bradycardia   . Prolonged QT interval   . Claudication   . HTN (hypertension)     Past Surgical History  Procedure Date  . Ovary surgery   . Melanoma excision     Leg  . Breast surgery     History  Smoking status  . Former Smoker  Smokeless tobacco  . Never Used    History  Alcohol Use No  Family History  Problem Relation Age of Onset  . Stroke Mother   . Cancer Sister   . Stroke Brother     Review of Systems: The review of systems is per the HPI.  All other systems were reviewed and are negative.  Physical Exam: BP 132/70  Pulse 60  Ht 5\' 5"  (1.651 m)  Wt 151 lb 12 oz (68.833 kg)  BMI 25.25 kg/m2 Patient is very pleasant and in no acute distress. She is [place]. Skin is warm and dry. Color is normal.  HEENT is unremarkable. Normocephalic/atraumatic. PERRL. Sclera are nonicteric. Neck is supple. No masses. No JVD. Lungs are clear. Cardiac exam shows a regular rate and rhythm. Abdomen is soft. Extremities are without edema. Gait and ROM are intact. No gross neurologic deficits noted.  LABORATORY DATA: PENDING FOR TODAY  Assessment / Plan:  1. HTN - with reported satisfactory BP at home. She is not taking her Hydralazine as prescribed and only taking occasionally. She  says she is taking her other medicines regularly.   2. HLD - now on low dose Lipitor but only taking occasionally as well.   3. Claudication - intolerant to Pletal - her symptoms seem unchanged.   4. Chest pain/palpitations/dyspnea - She does have multiple CV risk factors. She remains symptomatic. Despite her negative Myoview, we are going to proceed on with cardiac catheterization to further define. This procedure has been reviewed in full detail and she is willing to proceed. NTG is also prescribed today with full instruction. Her cath is set up for next Friday with Dr. Swaziland. She is to hold her Metformin after Thursday. Will check labs today and send her for a CXR.   Patient is agreeable to this plan and will call if any problems develop in the interim.

## 2012-01-04 NOTE — Progress Notes (Signed)
Bedrest begins @1245 .  Dr. Swaziland in to discuss results with patient and family.

## 2012-01-04 NOTE — Progress Notes (Signed)
Normal Allen's test performed on right hand.  Spo2 94%.

## 2012-01-18 ENCOUNTER — Encounter: Payer: Self-pay | Admitting: Nurse Practitioner

## 2012-01-18 ENCOUNTER — Ambulatory Visit (INDEPENDENT_AMBULATORY_CARE_PROVIDER_SITE_OTHER): Payer: Medicare Other | Admitting: Nurse Practitioner

## 2012-01-18 VITALS — BP 136/80 | HR 75 | Resp 20 | Ht 65.0 in | Wt 153.8 lb

## 2012-01-18 DIAGNOSIS — R002 Palpitations: Secondary | ICD-10-CM

## 2012-01-18 DIAGNOSIS — I7789 Other specified disorders of arteries and arterioles: Secondary | ICD-10-CM

## 2012-01-18 NOTE — Patient Instructions (Signed)
We are going to place a heart monitor for 24 hours  We are going to arrange for a scan on your chest to look at your aorta  We will set you up a visit to see Dr. Swaziland based on these results.  Call the [facility] office at [phone] if you have any questions, problems or concerns.

## 2012-01-18 NOTE — Progress Notes (Signed)
Ariana Adams Date of Birth: 09/05/1945 Medical Record #[id number]  History of Present Illness: Ariana Adams is seen back today for a post cath visit. She is seen for Dr. Swaziland. She has a history of PVD, past tobacco abuse, DM, HTN and HLD. She had prolonged QT that was aggravated by bradycardia which improved with reduction in her beta blocker and Diltiazem. She has had recent cath due to ongoing complaints of chest pain. Those results are as noted. She has had PVD and has seen Dr. Kirke Corin. He felt like surgical intervention was not warranted at this time given that her symptoms were not that severe and that walking, statin, Pletal and aggressive risk factor modification was advised. She did not tolerate the Pletal.   She comes in today. She is here with her husband. She still feels bad. She is having chest pain, shortness of breath and palpitations every evening. She is quite frustrated. Her primary has arranged for pulmonary follow up. She is actually quite active with house work, shopping, etc. She has no issues basically during the course of the day. She takes her Toprol at night.   Current Outpatient Prescriptions on File Prior to Visit  Medication Sig Dispense Refill  . aspirin 81 MG tablet Take 81 mg by mouth daily.      Marland Kitchen atorvastatin (LIPITOR) 10 MG tablet Take 0.5 tablets (5 mg total) by mouth daily.  15 tablet  6  . cloNIDine (CATAPRES) 0.3 MG tablet Take 0.3 mg by mouth 2 (two) times daily.       Marland Kitchen diltiazem (CARDIZEM CD) 240 MG 24 hr capsule Take 240 mg by mouth daily.       Marland Kitchen glimepiride (AMARYL) 2 MG tablet Take 2 mg by mouth daily before breakfast.       . hydrochlorothiazide (HYDRODIURIL) 50 MG tablet Take 50 mg by mouth daily.       . metoprolol succinate (TOPROL-XL) 25 MG 24 hr tablet Take 25 mg by mouth daily.       . nitroGLYCERIN (NITROSTAT) 0.4 MG SL tablet Place 1 tablet (0.4 mg total) under the tongue every 5 (five) minutes as needed for chest pain.  25 tablet  3  .  pioglitazone (ACTOS) 15 MG tablet Take 15 mg by mouth daily.        No Known Allergies  Past Medical History  Diagnosis Date  . Diabetes mellitus   . Dizziness   . Fatigue   . SOB (shortness of breath)     Mild  . Chest pressure   . Bradycardia   . Prolonged QT interval   . Claudication   . HTN (hypertension)     Past Surgical History  Procedure Date  . Ovary surgery   . Melanoma excision     Leg  . Breast surgery     History  Smoking status  . Former Smoker  Smokeless tobacco  . Never Used    History  Alcohol Use No    Family History  Problem Relation Age of Onset  . Stroke Mother   . Cancer Sister   . Stroke Brother     Review of Systems: The review of systems is per the HPI.  All other systems were reviewed and are negative.  Physical Exam: BP 136/80  Pulse 75  Resp 20  Ht 5\' 5"  (1.651 m)  Wt 153 lb 12.8 oz (69.763 kg)  BMI 25.59 kg/m2 Patient is very pleasant and in no acute distress. She does appear  frustrated. Skin is warm and dry. Color is normal.  HEENT is unremarkable. Normocephalic/atraumatic. PERRL. Sclera are nonicteric. Neck is supple. No masses. No JVD. Lungs are clear. Cardiac exam shows a regular rate and rhythm. Abdomen is soft. Extremities are without edema. Gait and ROM are intact. No gross neurologic deficits noted.   LABORATORY DATA:  Cardiac Cath Final Conclusions:   1. No significant coronary disease. 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  2. Normal left ventricular function.  3. Diffuse thoracic aortic enlargement.   Recommendations: Consider chest CT with contrast to assess aortic dimension   [facility]  Lab Results  Component Value Date   WBC 10.6* 12/28/2011   HGB 11.9* 12/28/2011   HCT 36.0 12/28/2011   PLT 380.0 12/28/2011   GLUCOSE 168* 01/04/2012   NA 139 12/28/2011   K 3.6 12/28/2011   CL 100 12/28/2011   CREATININE 1.1 12/28/2011   BUN 20 12/28/2011   CO2 29 12/28/2011   INR 1.0  12/28/2011   Dg Chest 2 View  12/28/2011  *RADIOLOGY REPORT*  Clinical Data: Chest pain, shortness of breath, pre catheterization.  CHEST - 2 VIEW  Comparison: None.  Findings: Trachea is midline.  Heart is at the upper limits of normal in size to mildly enlarged.  Thoracic aorta is calcified and somewhat tortuous.  Calcified granuloma in the left upper lobe. Minimal scarring in the lingula.  Lungs are otherwise clear.  No pleural fluid.  IMPRESSION: No acute findings.   Original Report Authenticated By: Reyes Ivan, M.D.     Assessment / Plan: 1. Dyspnea/Palpitations - We will place a 24 hour Holter. She has had her medicines reduced due to prolonged QT in the past.   2. Enlarged aorta - will arrange for CTA of the chest to further define.  We are going to get the above studies. Further disposition to follow as well as scheduling her return visit.   Patient is agreeable to this plan and will call if any problems develop in the interim.

## 2012-01-21 ENCOUNTER — Ambulatory Visit (INDEPENDENT_AMBULATORY_CARE_PROVIDER_SITE_OTHER)
Admission: RE | Admit: 2012-01-21 | Discharge: 2012-01-21 | Disposition: A | Discharge status: Home / Self Care | Payer: Medicare Other | Source: Ambulatory Visit | Attending: Nurse Practitioner | Admitting: Nurse Practitioner

## 2012-01-21 DIAGNOSIS — R002 Palpitations: Secondary | ICD-10-CM

## 2012-01-21 DIAGNOSIS — I7789 Other specified disorders of arteries and arterioles: Secondary | ICD-10-CM

## 2012-01-21 MED ORDER — IOHEXOL 350 MG/ML SOLN
80.0000 mL | Freq: Once | INTRAVENOUS | Status: AC | PRN
  Administered 2012-01-21: 80 mL via INTRAVENOUS

## 2012-01-24 ENCOUNTER — Telehealth: Payer: Self-pay | Admitting: Cardiology

## 2012-01-24 NOTE — Telephone Encounter (Signed)
Patient called CT Chest results given.Advised to see PCP to follow up thyroid nodule.Copy of Chest CT sent to Dr.Charity [facility].

## 2012-01-24 NOTE — Telephone Encounter (Signed)
plz return call to pt [phone] regarding test results

## 2012-02-11 ENCOUNTER — Telehealth: Payer: Self-pay

## 2012-02-11 NOTE — Telephone Encounter (Signed)
Patient called spoke to daughter Seward Grater was told Dr.Jordan reviewed patient's 24 hr monitor which revealed normal sinus rhythm and very rare PAC's,PVC.Advised to continue same medication,keep appointment,call earlier if needed.

## 2012-07-30 ENCOUNTER — Encounter: Payer: Self-pay | Admitting: Cardiology

## 2012-07-30 ENCOUNTER — Ambulatory Visit (INDEPENDENT_AMBULATORY_CARE_PROVIDER_SITE_OTHER): Payer: Medicare Other | Admitting: Cardiology

## 2012-07-30 VITALS — BP 110/66 | HR 68 | Ht 65.0 in | Wt 154.4 lb

## 2012-07-30 DIAGNOSIS — R9431 Abnormal electrocardiogram [ECG] [EKG]: Secondary | ICD-10-CM

## 2012-07-30 DIAGNOSIS — I10 Essential (primary) hypertension: Secondary | ICD-10-CM

## 2012-07-30 DIAGNOSIS — R42 Dizziness and giddiness: Secondary | ICD-10-CM | POA: Insufficient documentation

## 2012-07-30 NOTE — Progress Notes (Signed)
Ariana Adams Date of Birth: 05/07/1945 Medical Record #[id number]  History of Present Illness: Ariana Adams is seen today for followup. She has a history of PVD, past tobacco abuse, DM, HTN and HLD. She had prolonged QT that was aggravated by bradycardia which improved with reduction in her beta blocker and Diltiazem. She has had prior cardiac catheterization which demonstrated only mild nonobstructive disease. This past fall she presented with some shortness of breath and palpitations. A CT angiogram of the chest was obtained. This was unremarkable. She also had a 24-hour Holter monitor which showed only 2 PACs and one PVC in 24 hours. Today her primary complaint is of pain in the posterior aspect of her neck associated with muscle aches and pains. She was started on Valium about 5 days ago. This does help her sleep but she is still complaining of pain. She also complains of dizziness which is worse when she is having neck pain and worse when she tries to sit up. She has true vertigo symptoms with the room spinning around.  Current Outpatient Prescriptions on File Prior to Visit  Medication Sig Dispense Refill  . aspirin 81 MG tablet Take 81 mg by mouth daily.      . cloNIDine (CATAPRES) 0.3 MG tablet Take 0.3 mg by mouth 2 (two) times daily.       Marland Kitchen diltiazem (CARDIZEM CD) 240 MG 24 hr capsule Take 240 mg by mouth daily.       Marland Kitchen glimepiride (AMARYL) 2 MG tablet Take 2 mg by mouth daily before breakfast.       . hydrochlorothiazide (HYDRODIURIL) 50 MG tablet Take 50 mg by mouth daily.       . metFORMIN (GLUCOPHAGE) 1000 MG tablet Take 1,000 mg by mouth 2 (two) times daily with a meal.      . metoprolol succinate (TOPROL-XL) 25 MG 24 hr tablet Take 25 mg by mouth daily.       . nitroGLYCERIN (NITROSTAT) 0.4 MG SL tablet Place 1 tablet (0.4 mg total) under the tongue every 5 (five) minutes as needed for chest pain.  25 tablet  3  . pentoxifylline (TRENTAL) 400 MG CR tablet Take 400 mg by mouth 3  (three) times daily with meals.       No current facility-administered medications on file prior to visit.    No Known Allergies  Past Medical History  Diagnosis Date  . Diabetes mellitus   . Dizziness   . Fatigue   . SOB (shortness of breath)     Mild  . Chest pressure   . Bradycardia   . Prolonged QT interval   . Claudication   . HTN (hypertension)     Past Surgical History  Procedure Laterality Date  . Ovary surgery    . Melanoma excision      Leg  . Breast surgery      History  Smoking status  . Former Smoker  Smokeless tobacco  . Never Used    History  Alcohol Use No    Family History  Problem Relation Age of Onset  . Stroke Mother   . Cancer Sister   . Stroke Brother     Review of Systems: The review of systems is per the HPI.  All other systems were reviewed and are negative.  Physical Exam: BP 110/66  Pulse 68  Ht 5\' 5"  (1.651 m)  Wt 154 lb 6.4 oz (70.035 kg)  BMI 25.69 kg/m2  SpO2 99% Patient is very pleasant  and in no acute distress. She does appear frustrated. Skin is warm and dry. Color is normal.  HEENT is unremarkable. Normocephalic/atraumatic. PERRL. Sclera are nonicteric. Neck is supple. No masses. No JVD. Lungs are clear. Cardiac exam shows a regular rate and rhythm. Abdomen is soft. Extremities are without edema. Gait and ROM are intact. No gross neurologic deficits noted.   LABORATORY DATA: CT ANGIOGRAPHY CHEST WITH CONTRAST  Technique: Multidetector CT imaging of the chest was performed using the standard protocol during bolus administration of intravenous contrast. Multiplanar CT image reconstructions including MIPs were obtained to evaluate the vascular anatomy.  Contrast: 80mL OMNIPAQUE IOHEXOL 350 MG/ML SOLN  Comparison: None.  Findings: Adequate contrast opacification of the thoracic aorta with no evidence of dissection, aneurysm, or stenosis. The thoracic aorta measures 3 cm diameter at the sinotubular junction, 3.7  cm proximal ascending, 3.3 cm distal ascending/proximal arch, 2.9 cm distal arch/proximal descending, 2.5 cm distal descending just above the diaphragm. Left vertebral artery originates directly from the aortic arch, an anatomic variant. There is scattered calcified nonocclusive plaque noted at the origin and some of the right brachiocephalic and left common carotid arteries and also the distal arch and scattered through the descending thoracic aorta. There is a small focus of eccentric mural thrombus in the mid descending thoracic aorta.  Incomplete opacification of the pulmonary arterial tree, which is grossly unremarkable; exam was not optimized for detection of pulmonary emboli. There is a small pericardial effusion. No pleural effusion. Sub centimeter prevascular, precarinal, right paratracheal, and subcarinal lymph nodes. No hilar adenopathy. Subpleural scarring or dependent atelectasis posteriorly in both lower lobes. Small calcified granulomas in the posterior right upper lobe. Lungs otherwise clear. Visualized portions of upper abdomen unremarkable. 18 mm mixed attenuation nodule in the inferior aspect of the left thyroid lobe.  There is a central compression deformity of the superior endplate of L1. Mild spondylitic changes at multiple levels in the visualized lower cervical and thoracic spine.  Review of the MIP images confirms the above findings.  IMPRESSION:  1. Negative for thoracic aortic aneurysm, dissection, or other acute abnormality. 2. Left thyroid nodule. Consider further evaluation with thyroid ultrasound. If patient is clinically hyperthyroid, consider nuclear medicine thyroid uptake and scan. 3. Small pericardial effusion. 4. L1 superior endplate compression deformity, age indeterminate.  5. Atherosclerosis, including aortic arch, descending thoracic aorta, and coronary artery disease. Please note that although the presence of coronary artery calcium  documents the presence of coronary artery disease, the severity of this disease and any potential stenosis cannot be assessed on this non-gated CT examination. Assessment for potential risk factor modification, dietary therapy or pharmacologic therapy may be warranted, if clinically indicated.   Original Report Authenticated By: Osa Craver, M.D.  [facility] SITE 3 NUCLEAR MED  [address]  [place] [address]  [phone]  Cardiology Nuclear Med Study  Walterine Riche is a 67 y.o. female MRN : [id number] DOB: Sep 09, 1945  Procedure Date: 11/13/2011  Nuclear Med Background  Indication for Stress Test: Evaluation for Ischemia  History: n/a  Cardiac Risk Factors: Claudication, History of Smoking, Hypertension, Lipids and NIDDM  Symptoms: Chest Pain, Dizziness, Fatigue and SOB  Nuclear Pre-Procedure  Caffeine/Decaff Intake: None  NPO After: 7:30am   Lungs: clear  O2 Sat: 97% on room air.  IV 0.9% NS with Angio Cath: 20g   IV Site: R Antecubital  IV Started by: Stanton Kidney, EMT-P   Chest Size (in): 36  Cup Size:  C   Height: 5\' 6"  (1.676 m)  Weight: 153 lb (69.4 kg)   BMI: Body mass index is 24.69 kg/(m^2).  Tech Comments: Metoprolol held this am, per patient.   Nuclear Med Study  1 or 2 day study: 1 day  Stress Test Type: Eugenie Birks   Reading MD: Kristeen Miss, MD  Order Authorizing Provider: P.Jordan MD   Resting Radionuclide: Technetium 57m Tetrofosmin  Resting Radionuclide Dose: 11.0 mCi   Stress Radionuclide: Technetium 57m Tetrofosmin  Stress Radionuclide Dose: 33.0 mCi   Stress Protocol  Rest HR: 64  Stress HR: 108   Rest BP: 148/77  Stress BP: 181/81   Exercise Time (min): n/a  METS: n/a   Predicted Max HR: 153 bpm  % Max HR: 70.59 bpm  Rate Pressure Product: 47829  Dose of Adenosine (mg): n/a  Dose of Lexiscan: 0.4 mg   Dose of Atropine (mg): n/a  Dose of Dobutamine: n/a mcg/kg/min (at max HR)   Stress Test Technologist: Milana Na,  EMT-P  Nuclear Technologist: Domenic Polite, CNMT   Rest Procedure: Myocardial perfusion imaging was performed at rest 45 minutes following the intravenous administration of Technetium 66m Tetrofosmin.  Rest ECG: NSR - Normal EKG  Stress Procedure: The patient received IV Lexiscan 0.4 mg over 15-seconds. Technetium 4m Tetrofosmin injected at 30-seconds. There were no significant changes, + sob, nausea, chest pressure, and headache with Lexiscan. Quantitative spect images were obtained after a 45 minute delay.  Stress ECG: No significant change from baseline ECG  QPS  Raw Data Images: Normal; no motion artifact; normal heart/lung ratio.  Stress Images: Normal homogeneous uptake in all areas of the myocardium.  Rest Images: Normal homogeneous uptake in all areas of the myocardium.  Subtraction (SDS): Normal  Transient Ischemic Dilatation (Normal <1.22): 1.13  Lung/Heart Ratio (Normal <0.45): 0.27  Quantitative Gated Spect Images  QGS EDV: 82 ml  QGS ESV: 31 ml  Impression  Exercise Capacity: Lexiscan with no exercise.  BP Response: Normal blood pressure response.  Clinical Symptoms: No significant symptoms noted.  ECG Impression: No significant ST segment change suggestive of ischemia.  Comparison with Prior Nuclear Study: No images to compare  Overall Impression: Normal stress nuclear study. No evidence of ischemia. Normal LV systolic function.  LV Ejection Fraction: 62%. LV Wall Motion: NL LV Function; NL Wall Motion  Vesta Mixer, Montez Hageman., MD, [facility]  11/13/2011, 4:30 PM  Office - [phone]  Pager [phone]     Cardiac Cath Final Conclusions:   1. No significant coronary disease. 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  2. Normal left ventricular function.  3. Diffuse thoracic aortic enlargement.   Recommendations: Consider chest CT with contrast to assess aortic dimension   [facility]  Lab Results  Component Value Date   WBC 10.6*  12/28/2011   HGB 11.9* 12/28/2011   HCT 36.0 12/28/2011   PLT 380.0 12/28/2011   GLUCOSE 168* 01/04/2012   NA 139 12/28/2011   K 3.6 12/28/2011   CL 100 12/28/2011   CREATININE 1.1 12/28/2011   BUN 20 12/28/2011   CO2 29 12/28/2011   INR 1.0 12/28/2011   Dg Chest 2 View  12/28/2011  *RADIOLOGY REPORT*  Clinical Data: Chest pain, shortness of breath, pre catheterization.  CHEST - 2 VIEW  Comparison: None.  Findings: Trachea is midline.  Heart is at the upper limits of normal in size to mildly enlarged.  Thoracic aorta is calcified and somewhat tortuous.  Calcified granuloma  in the left upper lobe. Minimal scarring in the lingula.  Lungs are otherwise clear.  No pleural fluid.  IMPRESSION: No acute findings.   Original Report Authenticated By: Reyes Ivan, M.D.     Assessment / Plan: 1. Dizziness with true vertigo symptoms. I suspect this is mostly related to an inner ear problem. It may be exacerbated by her neck pain and muscle spasm. She has had extensive cardiac evaluation in the past year including stress testing, cardiac catheterization, Holter monitor, and CT of the chest which have been unrevealing. I do not feel that her symptoms are in any way cardiac related. I reassured her concerning these prior findings. I recommended that if she has ongoing neck pain she should follow up with her primary care. She may need to be considered for vestibular rehabilitation. I'll see her as needed.

## 2012-07-30 NOTE — Patient Instructions (Signed)
Continue your current therapy  I will see you as needed. 

## 2013-01-06 ENCOUNTER — Other Ambulatory Visit: Payer: Self-pay | Admitting: Psychiatry

## 2013-01-06 DIAGNOSIS — R51 Headache: Secondary | ICD-10-CM

## 2013-01-06 DIAGNOSIS — M542 Cervicalgia: Secondary | ICD-10-CM

## 2013-01-11 ENCOUNTER — Ambulatory Visit
Admission: RE | Admit: 2013-01-11 | Discharge: 2013-01-11 | Disposition: A | Discharge status: Home / Self Care | Payer: Medicare Other | Source: Ambulatory Visit | Attending: Psychiatry | Admitting: Psychiatry

## 2013-01-11 DIAGNOSIS — R51 Headache: Secondary | ICD-10-CM

## 2013-01-11 DIAGNOSIS — M542 Cervicalgia: Secondary | ICD-10-CM

## 2013-02-06 ENCOUNTER — Ambulatory Visit
Admission: RE | Admit: 2013-02-06 | Discharge: 2013-02-06 | Disposition: A | Discharge status: Home / Self Care | Payer: Medicare Other | Source: Ambulatory Visit | Attending: Orthopedic Surgery | Admitting: Orthopedic Surgery

## 2013-02-06 ENCOUNTER — Other Ambulatory Visit: Payer: Self-pay | Admitting: Orthopedic Surgery

## 2013-02-06 DIAGNOSIS — M479 Spondylosis, unspecified: Secondary | ICD-10-CM

## 2013-02-06 DIAGNOSIS — M898X9 Other specified disorders of bone, unspecified site: Secondary | ICD-10-CM

## 2014-09-13 ENCOUNTER — Encounter: Payer: Self-pay | Admitting: Vascular Surgery

## 2014-09-14 ENCOUNTER — Encounter: Payer: Self-pay | Admitting: Vascular Surgery

## 2014-09-15 ENCOUNTER — Other Ambulatory Visit: Payer: Self-pay | Admitting: Vascular Surgery

## 2014-09-15 ENCOUNTER — Ambulatory Visit (INDEPENDENT_AMBULATORY_CARE_PROVIDER_SITE_OTHER): Payer: Medicare Other | Admitting: Vascular Surgery

## 2014-09-15 ENCOUNTER — Encounter (INDEPENDENT_AMBULATORY_CARE_PROVIDER_SITE_OTHER): Payer: Self-pay

## 2014-09-15 ENCOUNTER — Encounter: Payer: Self-pay | Admitting: Vascular Surgery

## 2014-09-15 VITALS — BP 118/66 | HR 69 | Resp 14 | Ht 63.0 in | Wt 155.0 lb

## 2014-09-15 DIAGNOSIS — I700 Atherosclerosis of aorta: Secondary | ICD-10-CM | POA: Diagnosis not present

## 2014-09-15 DIAGNOSIS — K551 Chronic vascular disorders of intestine: Secondary | ICD-10-CM | POA: Diagnosis not present

## 2014-09-15 NOTE — Addendum Note (Signed)
Addended by: Sharee PimpleMCCHESNEY, MARILYN K on: 09/15/2014 03:22 PM   Modules accepted: Orders

## 2014-09-15 NOTE — Progress Notes (Signed)
Referred by:  [place] [id number] [facility] [place], [place] [address]  Reason for referral: possible mesenteric ischemia  History of Present Illness  Ariana Adams is a 69 y.o. (1946-03-05) female who presents with chief complaint: abdominal pain.  Patient notes onset of abdominal pain 2-3 month prior to presentation.  Pain is describe as chronic, diffuse pain ranging character from crampy to burning in nature.  Intensity ranges from mild to moderate without any obvious triggers or relievers.  She has had some episodes of emesis without hematemesis or melena or hematochezia.  She denies food fear or postprandial pain.  She denies any weight loss.  Prior abd surgery included TAH and appendectomy.  Her atherosclerotic risk factors include: DM, HTN, and prior smoking.  Incidentally, she also has sx of intermittent claudication at short distances (<1 block).   Past Medical History  Diagnosis Date  . Diabetes mellitus   . Dizziness   . Fatigue   . SOB (shortness of breath)     Mild  . Chest pressure   . Bradycardia   . Prolonged QT interval   . Claudication   . HTN (hypertension)     Past Surgical History  Procedure Laterality Date  . Ovary surgery    . Melanoma excision      Leg  . Breast surgery      History   Social History  . Marital Status: Married    Spouse Name: N/A  . Number of Children: 2  . Years of Education: N/A   Occupational History  . Not on file.   Social History Main Topics  . Smoking status: Former Smoker    Quit date: 09/14/2004  . Smokeless tobacco: Never Used  . Alcohol Use: No  . Drug Use: No  . Sexual Activity: Not Currently   Other Topics Concern  . Not on file   Social History Narrative    Family History  Problem Relation Age of Onset  . Stroke Mother   . Hypertension Mother   . Cancer Sister   . Diabetes Sister   . Stroke Brother   . Diabetes Daughter   . Diabetes Son     Current  Outpatient Prescriptions  Medication Sig Dispense Refill  . aspirin 81 MG tablet Take 81 mg by mouth daily.    . cloNIDine (CATAPRES) 0.3 MG tablet Take 0.3 mg by mouth 2 (two) times daily.     . diazepam (VALIUM) 2 MG tablet     . diltiazem (CARDIZEM CD) 240 MG 24 hr capsule Take 240 mg by mouth daily.     Marland Kitchen glimepiride (AMARYL) 2 MG tablet Take 2 mg by mouth daily before breakfast.     . hydrochlorothiazide (HYDRODIURIL) 50 MG tablet Take 50 mg by mouth daily.     . metFORMIN (GLUCOPHAGE) 1000 MG tablet Take 1,000 mg by mouth 2 (two) times daily with a meal.    . metoprolol succinate (TOPROL-XL) 25 MG 24 hr tablet Take 25 mg by mouth daily.     . pentoxifylline (TRENTAL) 400 MG CR tablet Take 400 mg by mouth 3 (three) times daily with meals.    . cloNIDine (CATAPRES) 0.2 MG tablet     . lisinopril-hydrochlorothiazide (PRINZIDE,ZESTORETIC) 10-12.5 MG per tablet     . nitroGLYCERIN (NITROSTAT) 0.4 MG SL tablet Place 1 tablet (0.4 mg total) under the tongue every 5 (five) minutes as needed for chest pain. 25 tablet 3  . polyethylene glycol-electrolytes (  NULYTELY/GOLYTELY) 420 G solution      No current facility-administered medications for this visit.     No Known Allergies   REVIEW OF SYSTEMS:  (Positives checked otherwise negative)  CARDIOVASCULAR:   chest pain,  chest pressure,  palpitations,  shortness of breath when laying flat,  shortness of breath with exertion,   pain in feet when walking,  pain in feet when laying flat,  history of blood clot in veins (DVT),  history of phlebitis,  swelling in legs,  varicose veins  PULMONARY:   productive cough,  asthma,  wheezing  NEUROLOGIC:   weakness in arms or legs,  numbness in arms or legs,  difficulty speaking or slurred speech,  temporary loss of vision in one eye,  dizziness  HEMATOLOGIC:   bleeding problems,  problems with blood clotting too easily  MUSCULOSKEL:   joint pain,  joint  swelling  GASTROINTEST:   vomiting blood,  blood in stool     GENITOURINARY:   burning with urination,  blood in urine  PSYCHIATRIC:   history of major depression  INTEGUMENTARY:   rashes,  ulcers  CONSTITUTIONAL:   fever,  chills  For VQI Use Only  PRE-ADM LIVING: Home  AMB STATUS: Ambulatory  CAD Sx: None  PRIOR CHF: None  STRESS TEST:  No,  Normal,  + ischemia,  + MI,  Both   Physical Examination  Filed Vitals:   09/15/14 1054  BP: 118/66  Pulse: 69  Resp: 14  Height:  (1.6 m)  Weight: 155 lb (70.308 kg)    Body mass index is 27.46 kg/(m^2).  General: A&O x 3, WDWN  Head: [place]/AT  Ear/Nose/Throat: Hearing grossly intact, nares w/o erythema or drainage, oropharynx w/o Erythema/Exudate, Mallampati score: 3  Eyes: PERRLA, EOMI  Neck: Supple, no nuchal rigidity, no palpable LAD  Pulmonary: Sym exp, good air movt, CTAB, no rales, rhonchi, & wheezing  Cardiac: RRR, Nl S1, S2, no Murmurs, rubs or gallops  Vascular: Vessel Right Left  Radial Palpable Palpable  Brachial Palpable Palpable  Carotid Palpable, without bruit Palpable, without bruit  Aorta Not palpable N/A  Femoral Palpable Palpable  Popliteal Not palpable Not palpable  PT Not Palpable Not Palpable  DP Not Palpable Faintly Palpable   Gastrointestinal: soft, diffuse TTP: max epigastric, ND, NABS, -G/R, - HSM, - masses, - CVAT B  Musculoskeletal: M/S 5/5 throughout , Extremities without ischemic changes   Neurologic: CN 2-12 intact , Pain and light touch intact in extremities , Motor exam as listed above  Psychiatric: Judgment intact, Mood & affect appropriate for pt's clinical situation  Dermatologic: See M/S exam for extremity exam, no rashes otherwise noted  Lymph : No Cervical, Axillary, or Inguinal lymphadenopathy    Outside Studies/Documentation 5 pages of outside documents were reviewed including: including GI notes and CT abd/pelvis.  Medical  Decision Making  Ariana Adams is a 69 y.o. female who presents with: abd pain, possible abd aortic atherosclerosis.   Pt sx are not consistent with classic chronic mesenteric ischemia but the report is obviously concerning.  Pt did not come with CT Abd/pelvis on disc, so I could not review the scan.  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 Porcelain aorta  I have ordered a CTA chest/abd/pelvis to fully evaluate the aorta.  The chest CTA is ordered to determine if the descending thoracic aorta could be used for inflow for an antegrade bypass.  The current CT  report suggests the standard open mesenteric revascularization options are impossible due to the severe calcification.  I discussed in depth with the patient the nature of atherosclerosis, and emphasized the importance of maximal medical management including strict control of blood pressure, blood glucose, and lipid levels, antiplatelet agents, obtaining regular exercise, and cessation of smoking.    The patient is aware that without maximal medical management the underlying atherosclerotic disease process will progress, limiting the benefit of any interventions. The patient will follow up in 1-2 weeks with the CTA. The patient is currently not on a statin.  Will defer to PCP that evaluation per pt's request. The patient is currently on an anti-platelet: ASA.  Thank you for allowing us to participate in this patient's care.  Leonides SakeBrian Chen, MD [facility] Office: [phone] Pager: [phone]  09/15/2014, 11:55 AM

## 2014-09-16 LAB — CREATININE, SERUM: CREATININE: 1 mg/dL (ref 0.50–1.10)

## 2014-09-16 LAB — BUN: BUN: 14 mg/dL (ref 6–23)

## 2014-09-17 ENCOUNTER — Encounter: Payer: Self-pay | Admitting: Vascular Surgery

## 2014-09-22 ENCOUNTER — Ambulatory Visit
Admission: RE | Admit: 2014-09-22 | Discharge: 2014-09-22 | Disposition: A | Discharge status: Home / Self Care | Payer: Medicare Other | Source: Ambulatory Visit | Attending: Vascular Surgery | Admitting: Vascular Surgery

## 2014-09-22 ENCOUNTER — Encounter: Payer: Self-pay | Admitting: Vascular Surgery

## 2014-09-22 ENCOUNTER — Ambulatory Visit (INDEPENDENT_AMBULATORY_CARE_PROVIDER_SITE_OTHER): Payer: Medicare Other | Admitting: Vascular Surgery

## 2014-09-22 VITALS — BP 140/78 | HR 73 | Resp 16 | Ht 63.0 in | Wt 154.0 lb

## 2014-09-22 DIAGNOSIS — K551 Chronic vascular disorders of intestine: Secondary | ICD-10-CM | POA: Diagnosis not present

## 2014-09-22 DIAGNOSIS — I700 Atherosclerosis of aorta: Secondary | ICD-10-CM

## 2014-09-22 MED ORDER — IOHEXOL 350 MG/ML SOLN
80.0000 mL | Freq: Once | INTRAVENOUS | Status: AC | PRN
  Administered 2014-09-22: 80 mL via INTRAVENOUS

## 2014-09-22 NOTE — Progress Notes (Signed)
Established Mesenteric Ischemia  History of Present Illness  Ariana Adams is a 69 y.o. (23-Sep-1945) female who presents with chief complaint: unchanged abd sx.  Pt has atypical mesenteric ischemia sx.  The patient returns today with CTA chest, abd, and pelvis.  The patient's PMH, PSH, SH, FamHx, Med, and Allergies are unchanged from 09/15/14.  On ROS today: no melena, no hematochezia   Physical Examination  Filed Vitals:   09/22/14 1630 09/22/14 1632  BP: 150/82 140/78  Pulse: 73 73  Resp: 16   Height:  (1.6 m)   Weight: 154 lb (69.854 kg)    Body mass index is 27.29 kg/(m^2).  General: A&O x 3, WDWN  Pulmonary: Sym exp, good air movt, CTAB, no rales, rhonchi, & wheezing  Cardiac: RRR, Nl S1, S2, no Murmurs, rubs or gallops  Vascular: Vessel Right Left  Radial Palpable Palpable  Brachial Palpable Palpable  Carotid Palpable, without bruit Palpable, without bruit  Aorta Not palpable N/A  Femoral Palpable Palpable  Popliteal Not palpable Not palpable  PT Not Palpable Not Palpable  DP Not Palpable Faintly Palpable   Gastrointestinal: soft, diffuse TTP: max epigastric, ND, NABS, -G/R, - HSM, - masses, - CVAT B  Musculoskeletal: M/S 5/5 throughout , Extremities without ischemic changes   Neurologic: Pain and light touch intact in extremities , Motor exam as listed above   CTA Chest/abd/pelvis (09/22/2014) No evidence of thoracic aortic aneurysm or dissection.  11 mm low density is noted in left thyroid lobe which is unchanged compared to prior exam.  Atherosclerosis of abdominal aorta is noted without aneurysm or  dissection. No evidence of significant renal artery stenosis is noted. 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 Severe stenosis is noted at the origin of the superior mesenteric artery secondary to heavily calcified plaque formation.  Potentially this may represent chronic mesenteric ischemia.  4.2 cm cystic lesion seen in left adnexal region as described on prior ultrasound of August 03, 2014.  Based on my review of this patient's CTA, pt has extensive atherosclerosis in descending thoracic aorta, but there appears to be soft area anteriorly to base an antegrade bypass.  There is a short segment of calcification in the celiac artery that appears to be stenosed.  The lumen appears to be patent.  There are findings suggestive of possible hemodynamically significant celiac stenosis with post-stenotic dilation, but this is not totally obvious.  Additionally, the SMA looks severe stenosed with heavy calcification.  I can't see the IMA on these images, but the study was read as patent IMA.  Medical Decision Making  Ariana Adams is a 68 y.o. 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  The celiac appears open but further evaluation with dedicated angiography will be needed.  The IMA patency needs to be verified.  In general, CMI usually requires 2 vessel occlusion.  If both the celiac and SMA are compromised with time, this patient will definitely develop CMI.  Based on her exam and studies, I have offered the patient: mesenteric angiogram, possible SMA stenting.  I discussed with the patient the nature of angiographic procedures, especially the limited patencies of any endovascular intervention.  The patient is aware of that the risks of an angiographic procedure include but are not  limited to: bleeding, infection, access site complications, renal failure, embolization, rupture of vessel, dissection, possible need for emergent surgical intervention, possible need for surgical procedures to treat the patient's pathology, anaphylactic  reaction to contrast, and stroke and death.    The patient and husband are well aware that the SMA is at risk of thrombosing with a stent.  They are also aware it is likely to thrombose without intervention.    The patient is aware of the risks and agrees to proceed.  I am gone for the next two weeks, so I will arrange for Dr. Myra GianottiBrabham to complete the mesenteric angiogram, possible SMA stenting on 09/29/14.  If it turns out to be impossible to stent the SMA, may have to consider an antergrade bypass from descending thoracic aorta to celiac and SMA.  Leonides SakeBrian Chen, MD [facility] Office: [phone] Pager: [phone]  09/22/2014, 5:32 PM

## 2014-09-23 ENCOUNTER — Other Ambulatory Visit: Payer: Self-pay

## 2014-09-29 ENCOUNTER — Encounter (HOSPITAL_COMMUNITY): Payer: Self-pay | Admitting: Surgery

## 2014-09-29 ENCOUNTER — Other Ambulatory Visit: Payer: Self-pay | Admitting: *Deleted

## 2014-09-29 ENCOUNTER — Ambulatory Visit (HOSPITAL_COMMUNITY)
Admission: RE | Admit: 2014-09-29 | Discharge: 2014-09-29 | Disposition: A | Discharge status: Home / Self Care | Payer: Medicare Other | Source: Ambulatory Visit | Attending: Surgery | Admitting: Surgery

## 2014-09-29 ENCOUNTER — Encounter (HOSPITAL_COMMUNITY)
Admission: RE | Disposition: A | Discharge status: Home / Self Care | Payer: Self-pay | Source: Ambulatory Visit | Attending: Surgery

## 2014-09-29 DIAGNOSIS — Z79899 Other long term (current) drug therapy: Secondary | ICD-10-CM | POA: Insufficient documentation

## 2014-09-29 DIAGNOSIS — E119 Type 2 diabetes mellitus without complications: Secondary | ICD-10-CM | POA: Diagnosis not present

## 2014-09-29 DIAGNOSIS — I70203 Unspecified atherosclerosis of native arteries of extremities, bilateral legs: Secondary | ICD-10-CM | POA: Insufficient documentation

## 2014-09-29 DIAGNOSIS — Z7982 Long term (current) use of aspirin: Secondary | ICD-10-CM | POA: Diagnosis not present

## 2014-09-29 DIAGNOSIS — I4510 Unspecified right bundle-branch block: Secondary | ICD-10-CM | POA: Diagnosis not present

## 2014-09-29 DIAGNOSIS — Z9862 Peripheral vascular angioplasty status: Secondary | ICD-10-CM

## 2014-09-29 DIAGNOSIS — K551 Chronic vascular disorders of intestine: Secondary | ICD-10-CM

## 2014-09-29 DIAGNOSIS — Z87891 Personal history of nicotine dependence: Secondary | ICD-10-CM | POA: Insufficient documentation

## 2014-09-29 DIAGNOSIS — I10 Essential (primary) hypertension: Secondary | ICD-10-CM | POA: Diagnosis not present

## 2014-09-29 DIAGNOSIS — R001 Bradycardia, unspecified: Secondary | ICD-10-CM | POA: Diagnosis not present

## 2014-09-29 DIAGNOSIS — I700 Atherosclerosis of aorta: Secondary | ICD-10-CM | POA: Diagnosis not present

## 2014-09-29 DIAGNOSIS — Z85820 Personal history of malignant melanoma of skin: Secondary | ICD-10-CM | POA: Diagnosis not present

## 2014-09-29 DIAGNOSIS — K550 Acute vascular disorders of intestine: Secondary | ICD-10-CM | POA: Diagnosis not present

## 2014-09-29 HISTORY — PX: PERIPHERAL VASCULAR CATHETERIZATION: SHX172C

## 2014-09-29 LAB — POCT I-STAT, CHEM 8
BUN: 14 mg/dL (ref 6–20)
CHLORIDE: 103 mmol/L (ref 101–111)
CREATININE: 1 mg/dL (ref 0.44–1.00)
Calcium, Ion: 1.26 mmol/L (ref 1.13–1.30)
Glucose, Bld: 174 mg/dL — ABNORMAL HIGH (ref 65–99)
HCT: 37 % (ref 36.0–46.0)
Hemoglobin: 12.6 g/dL (ref 12.0–15.0)
Potassium: 3.9 mmol/L (ref 3.5–5.1)
Sodium: 138 mmol/L (ref 135–145)
TCO2: 21 mmol/L (ref 0–100)

## 2014-09-29 LAB — POCT ACTIVATED CLOTTING TIME
Activated Clotting Time: 208 seconds
Activated Clotting Time: 214 seconds
Activated Clotting Time: 178 seconds

## 2014-09-29 LAB — GLUCOSE, CAPILLARY
Glucose-Capillary: 158 mg/dL — ABNORMAL HIGH (ref 65–99)
Glucose-Capillary: 159 mg/dL — ABNORMAL HIGH (ref 65–99)

## 2014-09-29 SURGERY — VISCERAL ANGIOGRAPHY
Anesthesia: LOCAL

## 2014-09-29 MED ORDER — MIDAZOLAM HCL 2 MG/2ML IJ SOLN
INTRAMUSCULAR | Status: AC
  Filled 2014-09-29: qty 2

## 2014-09-29 MED ORDER — ALUM & MAG HYDROXIDE-SIMETH 200-200-20 MG/5ML PO SUSP: 15.0000 mL | ORAL | Status: DC | PRN

## 2014-09-29 MED ORDER — PHENOL 1.4 % MT LIQD: 1.0000 | OROMUCOSAL | Status: DC | PRN

## 2014-09-29 MED ORDER — METOPROLOL TARTRATE 1 MG/ML IV SOLN: 2.0000 mg | INTRAVENOUS | Status: DC | PRN

## 2014-09-29 MED ORDER — OXYCODONE HCL 5 MG PO TABS: 5.0000 mg | ORAL_TABLET | ORAL | Status: DC | PRN

## 2014-09-29 MED ORDER — HEPARIN SODIUM (PORCINE) 1000 UNIT/ML IJ SOLN
INTRAMUSCULAR | Status: DC | PRN
  Administered 2014-09-29: 6000 [IU] via INTRAVENOUS

## 2014-09-29 MED ORDER — CLOPIDOGREL BISULFATE 75 MG PO TABS: 75.0000 mg | ORAL_TABLET | Freq: Every day | ORAL | Status: DC

## 2014-09-29 MED ORDER — ONDANSETRON HCL 4 MG/2ML IJ SOLN: 4.0000 mg | Freq: Four times a day (QID) | INTRAMUSCULAR | Status: DC | PRN

## 2014-09-29 MED ORDER — GUAIFENESIN-DM 100-10 MG/5ML PO SYRP: 15.0000 mL | ORAL_SOLUTION | ORAL | Status: DC | PRN

## 2014-09-29 MED ORDER — MORPHINE SULFATE 10 MG/ML IJ SOLN: 2.0000 mg | INTRAMUSCULAR | Status: DC | PRN

## 2014-09-29 MED ORDER — CLOPIDOGREL BISULFATE 300 MG PO TABS: 300.0000 mg | ORAL_TABLET | Freq: Once | ORAL | Status: DC

## 2014-09-29 MED ORDER — SODIUM CHLORIDE 0.9 % IV SOLN
INTRAVENOUS | Status: DC
  Administered 2014-09-29: 07:00:00 via INTRAVENOUS

## 2014-09-29 MED ORDER — LABETALOL HCL 5 MG/ML IV SOLN: 10.0000 mg | INTRAVENOUS | Status: DC | PRN

## 2014-09-29 MED ORDER — MIDAZOLAM HCL 2 MG/2ML IJ SOLN
INTRAMUSCULAR | Status: DC | PRN
  Administered 2014-09-29 (×2): 1 mg via INTRAVENOUS

## 2014-09-29 MED ORDER — HYDRALAZINE HCL 20 MG/ML IJ SOLN: 5.0000 mg | INTRAMUSCULAR | Status: DC | PRN

## 2014-09-29 MED ORDER — FENTANYL CITRATE (PF) 100 MCG/2ML IJ SOLN
INTRAMUSCULAR | Status: AC
  Filled 2014-09-29: qty 2

## 2014-09-29 MED ORDER — SODIUM CHLORIDE 0.9 % IV SOLN
1.0000 mL/kg/h | INTRAVENOUS | Status: DC
Start: 2014-09-29 — End: 2014-09-29

## 2014-09-29 MED ORDER — ACETAMINOPHEN 325 MG PO TABS: 325.0000 mg | ORAL_TABLET | ORAL | Status: DC | PRN

## 2014-09-29 MED ORDER — FENTANYL CITRATE (PF) 100 MCG/2ML IJ SOLN
INTRAMUSCULAR | Status: DC | PRN
  Administered 2014-09-29 (×2): 25 ug via INTRAVENOUS

## 2014-09-29 MED ORDER — ACETAMINOPHEN 325 MG RE SUPP: 325.0000 mg | RECTAL | Status: DC | PRN

## 2014-09-29 MED ORDER — LIDOCAINE HCL (PF) 1 % IJ SOLN
INTRAMUSCULAR | Status: DC | PRN
  Administered 2014-09-29: 30 mL

## 2014-09-29 MED ORDER — DOCUSATE SODIUM 100 MG PO CAPS: 100.0000 mg | ORAL_CAPSULE | Freq: Every day | ORAL | Status: DC

## 2014-09-29 SURGICAL SUPPLY — 21 items
BALLOON AVIATOR PLUS 4X20 (BALLOONS) ×2
CATH BEACON 5 .035 65 C1 TIP (CATHETERS) ×2
CATH OMNI FLUSH 5F 65CM (CATHETERS) ×2
CATH SOS OMNI O 5F 80CM (CATHETERS) ×2
COVER PRB 48X5XTLSCP FOLD TPE (BAG) ×1
COVER PROBE 5X48 (BAG) ×1
DEVICE CONTINUOUS FLUSH (MISCELLANEOUS) ×2
DRAPE ZERO GRAVITY STERILE (DRAPES) ×2
GUIDE CATH VISTA IMA 6F (CATHETERS) ×2
KIT ENCORE 26 ADVANTAGE (KITS) ×2
KIT MICROINTRODUCER 5F 7206 (SHEATH) ×2
KIT PV (KITS) ×2
SHEATH PINNACLE 5F 10CM (SHEATH) ×2
SHEATH PINNACLE 6F 10CM (SHEATH) ×2
STENT PALMAZ BLUE 5X15X80 (Permanent Stent) ×2 IMPLANT
SYR MEDRAD MARK V 150ML (SYRINGE)
TRANSDUCER W/STOPCOCK (MISCELLANEOUS) ×2
TRAY PV CATH (CUSTOM PROCEDURE TRAY) ×2
TUBING CIL FLEX 10 FLL-RA (TUBING) ×2
WIRE BENTSON .035X145CM (WIRE) ×2
WIRE SPARTACORE .014X190CM (WIRE) ×2

## 2014-09-29 NOTE — Interval H&P Note (Signed)
History and Physical Interval Note:  09/29/2014 8:46 AM  Ariana DecantJuliana Adams  has presented today for surgery, with the diagnosis of mesenteric insufficiency  The various methods of treatment have been discussed with the patient and family. After consideration of risks, benefits and other options for treatment, the patient has consented to  Procedure(s): Visceral Angiography (N/A) as a surgical intervention .  The patient's history has been reviewed, patient examined, no change in status, stable for surgery.  I have reviewed the patient's chart and labs.  Questions were answered to the patient's satisfaction.     Durene CalBrabham, [place]

## 2014-09-29 NOTE — H&P (View-Only) (Signed)
  [place] [address] [facility] [place], [place] [address]  Reason for referral: possible mesenteric ischemia  History of Present Illness  Ariana Adams is a 69 y.o. (03/19/1946) female who presents with chief complaint: abdominal pain.  Patient notes onset of abdominal pain 2-3 month prior to presentation.  Pain is describe as chronic, diffuse pain ranging character from crampy to burning in nature.  Intensity ranges from mild to moderate without any obvious triggers or relievers.  She has had some episodes of emesis without hematemesis or melena or hematochezia.  She denies food fear or postprandial pain.  She denies any weight loss.  Prior abd surgery included TAH and appendectomy.  Her atherosclerotic risk factors include: DM, HTN, and prior smoking.  Incidentally, she also has sx of intermittent claudication at short distances (<1 block).   Past Medical History  Diagnosis Date  . Diabetes mellitus   . Dizziness   . Fatigue   . SOB (shortness of breath)     Mild  . Chest pressure   . Bradycardia   . Prolonged QT interval   . Claudication   . HTN (hypertension)     Past Surgical History  Procedure Laterality Date  . Ovary surgery    . Melanoma excision      Leg  . Breast surgery      History   Social History  . Marital Status: Married    Spouse Name: N/A  . Number of Children: 2  . Years of Education: N/A   Occupational History  . Not on file.   Social History Main Topics  . Smoking status: Former Smoker    Quit date: 09/14/2004  . Smokeless tobacco: Never Used  . Alcohol Use: No  . Drug Use: No  . Sexual Activity: Not Currently   Other Topics Concern  . Not on file   Social History Narrative    Family History  Problem Relation Age of Onset  . Stroke Mother   . Hypertension Mother   . Cancer Sister   . Diabetes Sister   . Stroke Brother   . Diabetes Daughter   . Diabetes Son     Current  Outpatient Prescriptions  Medication Sig Dispense Refill  . aspirin 81 MG tablet Take 81 mg by mouth daily.    . cloNIDine (CATAPRES) 0.3 MG tablet Take 0.3 mg by mouth 2 (two) times daily.     . diazepam (VALIUM) 2 MG tablet     . diltiazem (CARDIZEM CD) 240 MG 24 hr capsule Take 240 mg by mouth daily.     . glimepiride (AMARYL) 2 MG tablet Take 2 mg by mouth daily before breakfast.     . hydrochlorothiazide (HYDRODIURIL) 50 MG tablet Take 50 mg by mouth daily.     . metFORMIN (GLUCOPHAGE) 1000 MG tablet Take 1,000 mg by mouth 2 (two) times daily with a meal.    . metoprolol succinate (TOPROL-XL) 25 MG 24 hr tablet Take 25 mg by mouth daily.     . pentoxifylline (TRENTAL) 400 MG CR tablet Take 400 mg by mouth 3 (three) times daily with meals.    . cloNIDine (CATAPRES) 0.2 MG tablet     . lisinopril-hydrochlorothiazide (PRINZIDE,ZESTORETIC) 10-12.5 MG per tablet     . nitroGLYCERIN (NITROSTAT) 0.4 MG SL tablet Place 1 tablet (0.4 mg total) under the tongue every 5 (five) minutes as needed for chest pain. 25 tablet 3  . polyethylene glycol-electrolytes (  NULYTELY/GOLYTELY) 420 G solution      No current facility-administered medications for this visit.     No Known Allergies   REVIEW OF SYSTEMS:  (Positives checked otherwise negative)  CARDIOVASCULAR:  [] chest pain, [] chest pressure, [] palpitations, [] shortness of breath when laying flat, [] shortness of breath with exertion,  [x] pain in feet when walking, [] pain in feet when laying flat, [] history of blood clot in veins (DVT), [] history of phlebitis, [] swelling in legs, [] varicose veins  PULMONARY:  [] productive cough, [] asthma, [] wheezing  NEUROLOGIC:  [] weakness in arms or legs, [] numbness in arms or legs, [] difficulty speaking or slurred speech, [] temporary loss of vision in one eye, [] dizziness  HEMATOLOGIC:  [] bleeding problems, [] problems with blood clotting too easily  MUSCULOSKEL:  [] joint pain, [] joint  swelling  GASTROINTEST:  [] vomiting blood, [] blood in stool     GENITOURINARY:  [] burning with urination, [] blood in urine  PSYCHIATRIC:  [] history of major depression  INTEGUMENTARY:  [] rashes, [] ulcers  CONSTITUTIONAL:  [] fever, [] chills  For VQI Use Only  PRE-ADM LIVING: Home  AMB STATUS: Ambulatory  CAD Sx: None  PRIOR CHF: None  STRESS TEST: [x] No, [ ] Normal, [ ] + ischemia, [ ] + MI, [ ] Both   Physical Examination  Filed Vitals:   09/15/14 1054  BP: 118/66  Pulse: 69  Resp: 14  Height: 5' 3" (1.6 m)  Weight: 155 lb (70.308 kg)    Body mass index is 27.46 kg/(m^2).  General: A&O x 3, WDWN  Head: Teller/AT  Ear/Nose/Throat: Hearing grossly intact, nares w/o erythema or drainage, oropharynx w/o Erythema/Exudate, Mallampati score: 3  Eyes: PERRLA, EOMI  Neck: Supple, no nuchal rigidity, no palpable LAD  Pulmonary: Sym exp, good air movt, CTAB, no rales, rhonchi, & wheezing  Cardiac: RRR, Nl S1, S2, no Murmurs, rubs or gallops  Vascular: Vessel Right Left  Radial Palpable Palpable  Brachial Palpable Palpable  Carotid Palpable, without bruit Palpable, without bruit  Aorta Not palpable N/A  Femoral Palpable Palpable  Popliteal Not palpable Not palpable  PT Not Palpable Not Palpable  DP Not Palpable Faintly Palpable   Gastrointestinal: soft, diffuse TTP: max epigastric, ND, NABS, -G/R, - HSM, - masses, - CVAT B  Musculoskeletal: M/S 5/5 throughout , Extremities without ischemic changes   Neurologic: CN 2-12 intact , Pain and light touch intact in extremities , Motor exam as listed above  Psychiatric: Judgment intact, Mood & affect appropriate for pt's clinical situation  Dermatologic: See M/S exam for extremity exam, no rashes otherwise noted  Lymph : No Cervical, Axillary, or Inguinal lymphadenopathy    Outside Studies/Documentation 5 pages of outside documents were reviewed including: including GI notes and CT abd/pelvis.  Medical  Decision Making  Ariana Adams is a 69 y.o. female who presents with: abd pain, possible abd aortic atherosclerosis.   Pt sx are not consistent with classic chronic mesenteric ischemia but the report is obviously concerning.  Pt did not come with CT Abd/pelvis on disc, so I could not review the scan.  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 Porcelain aorta  I have ordered a CTA chest/abd/pelvis to fully evaluate the aorta.  The chest CTA is ordered to determine if the descending thoracic aorta could be used for inflow for an antegrade bypass.  The current CT   report suggests the standard open mesenteric revascularization options are impossible due to the severe calcification.  I discussed in depth with the patient the nature of atherosclerosis, and emphasized the importance of maximal medical management including strict control of blood pressure, blood glucose, and lipid levels, antiplatelet agents, obtaining regular exercise, and cessation of smoking.    The patient is aware that without maximal medical management the underlying atherosclerotic disease process will progress, limiting the benefit of any interventions. The patient will follow up in 1-2 weeks with the CTA. The patient is currently not on a statin.  Will defer to PCP that evaluation per pt's request. The patient is currently on an anti-platelet: ASA.  Thank you for allowing us to participate in this patient's care.  Ariana Fayette, MD [facility] Office: [phone] Pager: [phone]  09/15/2014, 11:55 AM   

## 2014-09-29 NOTE — Progress Notes (Signed)
Site area: RFA Site Prior to Removal:  Level 0 Pressure Applied For:4120min Manual:yes    Patient Status During Pull:  stable Post Pull Site:  Level 0 Post Pull Instructions Given:  done Post Pull Pulses Present: palpable Dressing Applied:  clear Bedrest begins @1140   Comments:removed by Celeisha/canderson

## 2014-09-29 NOTE — Discharge Instructions (Signed)
°  Start Plavix 75 mg daily starting tomorrow  Angiogram, Care After Refer to this sheet in the next few weeks. These instructions provide you with information on caring for yourself after your procedure. Your health care provider may also give you more specific instructions. Your treatment has been planned according to current medical practices, but problems sometimes occur. Call your health care provider if you have any problems or questions after your procedure.  WHAT TO EXPECT AFTER THE PROCEDURE After your procedure, it is typical to have the following sensations:  Minor discomfort or tenderness and a small bump at the catheter insertion site. The bump should usually decrease in size and tenderness within 1 to 2 weeks.  Any bruising will usually fade within 2 to 4 weeks. HOME CARE INSTRUCTIONS   You may need to keep taking blood thinners if they were prescribed for you. Take medicines only as directed by your health care provider.  Do not apply powder or lotion to the site.  Do not take baths, swim, or use a hot tub until your health care provider approves.  You may shower 24 hours after the procedure. Remove the bandage (dressing) and gently wash the site with plain soap and water. Gently pat the site dry.  Inspect the site at least twice daily.  Limit your activity for the first 48 hours. Do not bend, squat, or lift anything over 20 lb (9 kg) or as directed by your health care provider.  Plan to have someone take you home after the procedure. Follow instructions about when you can drive or return to work. SEEK MEDICAL CARE IF:  You get light-headed when standing up.  You have drainage (other than a small amount of blood on the dressing).  You have chills.  You have a fever.  You have redness, warmth, swelling, or pain at the insertion site. SEEK IMMEDIATE MEDICAL CARE IF:   You develop chest pain or shortness of breath, feel faint, or pass out.  You have bleeding,  swelling larger than a walnut, or drainage from the catheter insertion site.  You develop pain, discoloration, coldness, or severe bruising in the leg or arm that held the catheter.  You develop bleeding from any other place, such as the bowels. You may see bright red blood in your urine or stools, or your stools may appear black and tarry.  You have heavy bleeding from the site. If this happens, hold pressure on the site. MAKE SURE YOU:  Understand these instructions.  Will watch your condition.  Will get help right away if you are not doing well or get worse. Document Released: 10/26/2004 Document Revised: 08/24/2013 Document Reviewed: 09/01/2012 [facility] Patient Information 2015 [employer]. This information is not intended to replace advice given to you by your health care provider. Make sure you discuss any questions you have with your health care provider.

## 2014-09-29 NOTE — Op Note (Addendum)
    Patient name: Ariana BasilJuliana Adams MRN: [id number] DOB: 05/29/1945 Sex: female  09/29/2014 Pre-operative Diagnosis: Derrick ischemia Post-operative diagnosis:  Same Surgeon:  Durene CalBrabham, Wells Procedure Performed:  1.  Ultrasound-guided access, right femoral artery  2.  Abdominal aortogram  3.  First order catheterization (superior mesenteric artery)  4.  Stent, superior mesenteric artery   Findings: Atretic-appearing, 90% stenosis within the superior mesenteric artery successfully treated using a balloon expandable 5 by15 balloon expandable stent with residual stenosis less than 10%   Indications:  The patient is suffering from symptoms consistent with mesenteric ischemia.  CT imaging revealed stenotic superior mesenteric artery.  She is here for further evaluation and possible intervention  Procedure:  The patient was identified in the holding area and taken to room 8.  The patient was then placed supine on the table and prepped and draped in the usual sterile fashion.  A time out was called.  Ultrasound was used to evaluate the right common femoral artery.  It was patent .  A digital ultrasound image was acquired.  A micropuncture needle was used to access the right common femoral artery under ultrasound guidance.  An 018 wire was advanced without resistance and a micropuncture sheath was placed.  The 018 wire was removed and a benson wire was placed.  The micropuncture sheath was exchanged for a 5 french sheath.  An omniflush catheter was advanced over the wire to the level of L-1.  An abdominal angiogram was obtained in the AP and lateral positions.    Findings:   Aortogram:  the suprarenal abdominal aorta is widely patent.  The infrarenal abdominal aorta is heavily calcified without significant stenosis.  The visualized portions of the iliac arteries bilaterally are widely patent but heavily calcified.  No significant renal artery stenosis.  Superior mesenteric artery::  the artery appeared  somewhat atretic versus stenotic proximally for a distance of approximately 12 mm.  At that point there is a collateral and beyond this the vessel is of uniform caliber however very small.  A paucity of distal branches were visualized.   Intervention:  After the images were obtained, the decision was made to proceed with intervention.  The sheath was upsized to a 6 sheath.  The patient was fully heparinized.  I was able to cannulate the superior mesenteric artery using a 014 [place] core wire and a SOS catheter.  The catheter was then removed and a  IM guide catheter was reinserted.  I predilated the lesion using a 4 x 20 balloon.  After the predilatation there was improved blood flow distally however there was an obvious dissection.  I then selected a Palmaz Blue 5 x 15 balloon expandable stent and performed primary stenting of the ostium of the superior mesenteric artery.  I then flared the stent inferiorly with the balloon.  Completion imaging revealed resolution of the stenosis with residual disease less than 10%.  Catheters and wires were removed.  The patient was taken the holding area for sheath pull following correction of her coagulation profile.     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 Durene CalWells Triva Hueber, M.D. [facility] [place] [place] Office: [phone] Pager:  [phone]

## 2014-10-01 ENCOUNTER — Telehealth: Payer: Self-pay | Admitting: Vascular Surgery

## 2014-10-01 NOTE — Telephone Encounter (Signed)
Spoke with pt to schedule, dpm °

## 2014-10-01 NOTE — Telephone Encounter (Signed)
-----   Message from Sharee Pimple, RN sent at 09/29/2014 10:17 AM EDT ----- Regarding: Schedule   ----- Message -----    From: Nada Libman, MD    Sent: 09/29/2014  10:07 AM      To: [facility] Charge Pool  09/29/2014:  Surgeon:  Durene Cal Procedure Performed:  1.  Ultrasound-guided access, right femoral artery  2.  Abdominal aortogram  3.  First order catheterization (superior mesenteric artery)  4.  Stent, superior mesenteric artery   Please schedule follow-up with Dr. Imogene Burn in one month with a mesenteric duplex prior to the visit

## 2014-11-03 ENCOUNTER — Encounter: Payer: Self-pay | Admitting: Vascular Surgery

## 2014-11-05 ENCOUNTER — Ambulatory Visit (HOSPITAL_COMMUNITY)
Admission: RE | Admit: 2014-11-05 | Discharge: 2014-11-05 | Disposition: A | Discharge status: Home / Self Care | Payer: Medicare Other | Source: Ambulatory Visit | Attending: Vascular Surgery | Admitting: Vascular Surgery

## 2014-11-05 ENCOUNTER — Ambulatory Visit (INDEPENDENT_AMBULATORY_CARE_PROVIDER_SITE_OTHER): Payer: Medicare Other | Admitting: Vascular Surgery

## 2014-11-05 ENCOUNTER — Encounter: Payer: Self-pay | Admitting: Vascular Surgery

## 2014-11-05 VITALS — BP 114/71 | HR 66 | Ht 63.0 in | Wt 154.0 lb

## 2014-11-05 DIAGNOSIS — K551 Chronic vascular disorders of intestine: Secondary | ICD-10-CM

## 2014-11-05 DIAGNOSIS — Z95828 Presence of other vascular implants and grafts: Secondary | ICD-10-CM | POA: Insufficient documentation

## 2014-11-05 DIAGNOSIS — Z48812 Encounter for surgical aftercare following surgery on the circulatory system: Secondary | ICD-10-CM | POA: Diagnosis present

## 2014-11-05 DIAGNOSIS — Z9889 Other specified postprocedural states: Secondary | ICD-10-CM

## 2014-11-05 DIAGNOSIS — K550 Acute vascular disorders of intestine: Secondary | ICD-10-CM | POA: Diagnosis not present

## 2014-11-05 DIAGNOSIS — Z9862 Peripheral vascular angioplasty status: Secondary | ICD-10-CM

## 2014-11-05 NOTE — Progress Notes (Addendum)
Established Mesenteric Ischemia  History of Present Illness  Ariana Adams is a 69 y.o. (Aug 03, 1945) female who presents with chief complaint: abd pain.  Pt is s/p SMA stenting on 09/29/14 by Dr. Myra Gianotti (as I gone on leave).  The patient notes continued abdominal pain but lesser intensity.  The sx again are atypical for CMI.  The patient's PMH, PSH, and SH, and FamHx are unchanged from 09/29/14.  Current Outpatient Prescriptions  Medication Sig Dispense Refill  . aspirin 81 MG tablet Take 81 mg by mouth daily.    . cloNIDine (CATAPRES) 0.2 MG tablet     . cloNIDine (CATAPRES) 0.3 MG tablet Take 0.3 mg by mouth 2 (two) times daily.     . clopidogrel (PLAVIX) 75 MG tablet Take 1 tablet (75 mg total) by mouth daily. 30 tablet 11  . diltiazem (CARDIZEM CD) 240 MG 24 hr capsule Take 240 mg by mouth daily.   2  . lisinopril-hydrochlorothiazide (PRINZIDE,ZESTORETIC) 10-12.5 MG per tablet Take 1 tablet by mouth daily.     . metFORMIN (GLUCOPHAGE) 1000 MG tablet Take 1,000 mg by mouth 2 (two) times daily with a meal.    . metoprolol succinate (TOPROL-XL) 25 MG 24 hr tablet Take 25 mg by mouth daily.   2  . nitroGLYCERIN (NITROSTAT) 0.4 MG SL tablet Place 1 tablet (0.4 mg total) under the tongue every 5 (five) minutes as needed for chest pain. 25 tablet 3  . pentoxifylline (TRENTAL) 400 MG CR tablet Take 400 mg by mouth 3 (three) times daily with meals.   2  . pravastatin (PRAVACHOL) 20 MG tablet      No current facility-administered medications for this visit.    No Known Allergies  On ROS today: abd pain, intermittent claudication .   Physical Examination  Filed Vitals:   11/05/14 0928  BP: 114/71  Pulse: 66  Height:  (1.6 m)  Weight: 154 lb (69.854 kg)  SpO2: 97%   Body mass index is 27.29 kg/(m^2).  General: A&O x 3, WDWN  Pulmonary: non-labor breathing, no wheezing  Cardiac: RRR, Nl S1, S2, no Murmurs, rubs or gallops  Vascular: palpable femoral pulse, no hematoma or  pseudoaneurysm at cannulation site  Gastrointestinal: soft, NTND, no G/R, no HSM, no masses, no CVAT B  Non-Invasive Vascular Imaging  Mesenteric Duplex (Date: 11/05/2014):                Difficult study  Ao: 60 c/s  Celiac artery: 345 c/s   SMA: 153-452 c/s  Mesenteric angiogram (09/29/14): I reviewed the study with the patient.  There is intact CA flow without obvious stenosis.  >90% SMA stenosis distal to orifice that near complete resolution with stenting.   Medical Decision Making  Ariana Adams is a 69 y.o. female who presents with: abd pain, s/p SMA stenting   As I suspected her abdominal pain is NOT related to mesenteric ischemia as this patient is essentially fully revascularized at this point (intact celiac on angio, stented SMA, intact IMA on CTA) with continued abdominal pain.  Based on her exam and studies, I have offered the patient annual mesenteric duplex.  I discussed in depth with the patient the nature of atherosclerosis, and emphasized the importance of maximal medical management including strict control of blood pressure, blood glucose, and lipid levels, obtaining regular exercise, and cessation of smoking.    The patient is aware that without maximal medical management the underlying atherosclerotic disease process will progress, limiting the  benefit of any interventions. The patient is currently on a statin: Pravastatin. The patient is currently on an anti-platelet: Plavix and ASA.  I told the patient she could drop ASA, as there is no need for double platelet agents.  Thank you for allowing us to participate in this patient's care.  Ariana SakeBrian Lakea Mittelman, MD [facility] Office: [phone] Pager: [phone]  11/05/2014, 10:53 AM

## 2015-09-12 ENCOUNTER — Other Ambulatory Visit: Payer: Self-pay | Admitting: Surgery

## 2015-11-04 ENCOUNTER — Encounter: Payer: Self-pay | Admitting: Vascular Surgery

## 2015-11-07 NOTE — Progress Notes (Signed)
Established Mesenteric Ischemia  History of Present Illness  Ariana Adams is a 70 y.o. (09-30-1945) female who presents with chief complaint: abd pain. Pt is s/p SMA stenting on 09/29/14 by Dr. Myra GianottiBrabham (as I gone on leave). The patient notes recurrence of abdominal pain. The sx again are atypical for CMI.  However, these sx went away after her prior SMA stenting.  Pt denies wt loss, food fear, hematochezia, or melena.  Past Medical History  Diagnosis Date  . Diabetes mellitus (HCC)   . Dizziness   . Fatigue   . SOB (shortness of breath)     Mild  . Chest pressure   . Bradycardia   . Prolonged QT interval   . Claudication (HCC)   . HTN (hypertension)     Past Surgical History  Procedure Laterality Date  . Ovary surgery    . Melanoma excision      Leg  . Breast surgery    . Peripheral vascular catheterization N/A 09/29/2014    Procedure: Visceral Angiography;  Surgeon: Nada LibmanVance W Brabham, MD;  Location: [facility] INVASIVE CV LAB;  Service: Cardiovascular;  Laterality: N/A;    Social History   Social History  . Marital Status: Married    Spouse Name: N/A  . Number of Children: 2  . Years of Education: N/A   Occupational History  . Not on file.   Social History Main Topics  . Smoking status: Former Smoker    Quit date: 09/14/2004  . Smokeless tobacco: Never Used  . Alcohol Use: No  . Drug Use: No  . Sexual Activity: Not Currently   Other Topics Concern  . Not on file   Social History Narrative    Family History  Problem Relation Age of Onset  . Stroke Mother   . Hypertension Mother   . Cancer Sister   . Diabetes Sister   . Stroke Brother   . Diabetes Daughter   . Diabetes Son     Current Outpatient Prescriptions  Medication Sig Dispense Refill  . aspirin 81 MG tablet Take 81 mg by mouth daily.    . cloNIDine (CATAPRES) 0.3 MG tablet Take 0.3 mg by mouth 2 (two) times daily.     . clopidogrel (PLAVIX) 75 MG tablet TAKE 1 TABLET BY MOUTH EVERY DAY 30 tablet  3  . diltiazem (CARDIZEM CD) 240 MG 24 hr capsule Take 240 mg by mouth daily.   2  . lisinopril-hydrochlorothiazide (PRINZIDE,ZESTORETIC) 10-12.5 MG per tablet Take 1 tablet by mouth daily.     . metFORMIN (GLUCOPHAGE) 1000 MG tablet Take 1,000 mg by mouth 2 (two) times daily with a meal.    . metoprolol succinate (TOPROL-XL) 25 MG 24 hr tablet Take 25 mg by mouth daily.   2  . pentoxifylline (TRENTAL) 400 MG CR tablet Take 400 mg by mouth 3 (three) times daily with meals.   2  . pravastatin (PRAVACHOL) 20 MG tablet     . nitroGLYCERIN (NITROSTAT) 0.4 MG SL tablet Place 1 tablet (0.4 mg total) under the tongue every 5 (five) minutes as needed for chest pain. 25 tablet 3   No current facility-administered medications for this visit.     No Known Allergies   REVIEW OF SYSTEMS:  (Positives checked otherwise negative)  CARDIOVASCULAR:   [ ]  chest pain,  [ ]  chest pressure,  [ ]  palpitations,  [ ]  shortness of breath when laying flat,  [ ]  shortness of breath with exertion,   [ ]   pain in feet when walking,   pain in feet when laying flat,  history of blood clot in veins (DVT),   history of phlebitis,   swelling in legs,   varicose veins  PULMONARY:    productive cough,   asthma,   wheezing  NEUROLOGIC:    weakness in arms or legs,   numbness in arms or legs,   difficulty speaking or slurred speech,   temporary loss of vision in one eye,   dizziness  HEMATOLOGIC:    bleeding problems,   problems with blood clotting too easily  MUSCULOSKEL:    joint pain,  joint swelling  GASTROINTEST:    vomiting blood,   blood in stool     GENITOURINARY:    burning with urination,   blood in urine  PSYCHIATRIC:    history of major depression  INTEGUMENTARY:    rashes,   ulcers  CONSTITUTIONAL:    fever,   chills    Physical Examination Filed Vitals:   11/11/15 0904 11/11/15 0908  BP:  159/82 148/78  Pulse: 60 60  Temp: 97 F (36.1 C)   TempSrc: Oral   Resp: 16   Height:  (1.651 m)   Weight: 154 lb (69.854 kg)   SpO2: 66%     Body mass index is 25.63 kg/(m^2).  General: A&O x 3, WDWN  Pulmonary: non-labor breathing, no wheezing  Cardiac: RRR, Nl S1, S2, no Murmurs, rubs or gallops  Vascular: Vessel Right Left  Radial Palpable Palpable  Brachial Palpable Palpable  Carotid Palpable, without bruit Palpable, without bruit  Aorta Not palpable N/A  Femoral Palpable Palpable  Popliteal Not palpable Not palpable  PT Palpable Palpable  DP Palpable Palpable    Gastrointestinal: NAS, soft, NTND, no G/R, no HSM, no masses, no CVAT B  Non-Invasive Vascular Imaging  Mesenteric Duplex (11/11/2015 ):   Ao: 65 c/s  Celiac artery: 335 c/s   SMA: 30 c/s, unable to visualize color flow in SMA   Medical Decision Making  Ariana Adams is a 70 y.o. 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  Based on her exam and studies, I have offered the patient: mesenteric angiography, possible SMA PTA.  This is scheduled for 27 JUL 17. I discussed with the patient the nature of angiographic procedures, especially the limited patencies of any endovascular intervention.   The patient is aware of that the risks of an angiographic procedure include but are not limited to: bleeding, infection, access site complications, renal failure, embolization, rupture of vessel, dissection, arteriovenous fistula, possible need for emergent surgical intervention, possible need for surgical procedures to treat the patient's pathology, anaphylactic reaction to contrast, and stroke and death.    The patient is aware of the risks and agrees to proceed.  I discussed in depth with the patient the nature of atherosclerosis, and emphasized the importance of maximal medical management  including strict control of blood pressure, blood glucose, and lipid levels, obtaining regular exercise, and cessation of smoking.   The patient is aware that without maximal medical management the underlying atherosclerotic disease process will progress, limiting the benefit of any interventions.  The patient is currently on a statin: Pravastatin.  The patient is currently on an anti-platelet: Plavix and  ASA. I told the patient she could drop ASA, as there is no need for double platelet agents.  Thank you for allowing [place] to participate in this patient's care.  Leonides Sake, MD [facility] Office: [phone] Pager: [phone]

## 2015-11-11 ENCOUNTER — Encounter: Payer: Self-pay | Admitting: Vascular Surgery

## 2015-11-11 ENCOUNTER — Ambulatory Visit (INDEPENDENT_AMBULATORY_CARE_PROVIDER_SITE_OTHER): Payer: Medicare Other | Admitting: Vascular Surgery

## 2015-11-11 ENCOUNTER — Ambulatory Visit (HOSPITAL_COMMUNITY)
Admission: RE | Admit: 2015-11-11 | Discharge: 2015-11-11 | Disposition: A | Discharge status: Home / Self Care | Payer: Medicare Other | Source: Ambulatory Visit | Attending: Vascular Surgery | Admitting: Vascular Surgery

## 2015-11-11 ENCOUNTER — Other Ambulatory Visit: Payer: Self-pay

## 2015-11-11 VITALS — BP 148/78 | HR 60 | Temp 97.0°F | Resp 16 | Ht 65.0 in | Wt 154.0 lb

## 2015-11-11 DIAGNOSIS — K551 Chronic vascular disorders of intestine: Secondary | ICD-10-CM | POA: Insufficient documentation

## 2015-11-11 DIAGNOSIS — I10 Essential (primary) hypertension: Secondary | ICD-10-CM | POA: Insufficient documentation

## 2015-11-11 DIAGNOSIS — E119 Type 2 diabetes mellitus without complications: Secondary | ICD-10-CM | POA: Diagnosis not present

## 2015-11-11 DIAGNOSIS — R938 Abnormal findings on diagnostic imaging of other specified body structures: Secondary | ICD-10-CM | POA: Insufficient documentation

## 2015-11-17 ENCOUNTER — Encounter (HOSPITAL_COMMUNITY)
Admission: RE | Disposition: A | Discharge status: Home / Self Care | Payer: Self-pay | Source: Ambulatory Visit | Attending: Vascular Surgery

## 2015-11-17 ENCOUNTER — Ambulatory Visit (HOSPITAL_COMMUNITY)
Admission: RE | Admit: 2015-11-17 | Discharge: 2015-11-17 | Disposition: A | Discharge status: Home / Self Care | Payer: Medicare Other | Source: Ambulatory Visit | Attending: Vascular Surgery | Admitting: Vascular Surgery

## 2015-11-17 DIAGNOSIS — E1151 Type 2 diabetes mellitus with diabetic peripheral angiopathy without gangrene: Secondary | ICD-10-CM | POA: Insufficient documentation

## 2015-11-17 DIAGNOSIS — R001 Bradycardia, unspecified: Secondary | ICD-10-CM | POA: Insufficient documentation

## 2015-11-17 DIAGNOSIS — Z8249 Family history of ischemic heart disease and other diseases of the circulatory system: Secondary | ICD-10-CM | POA: Insufficient documentation

## 2015-11-17 DIAGNOSIS — T82858A Stenosis of vascular prosthetic devices, implants and grafts, initial encounter: Secondary | ICD-10-CM | POA: Diagnosis not present

## 2015-11-17 DIAGNOSIS — Z7982 Long term (current) use of aspirin: Secondary | ICD-10-CM | POA: Insufficient documentation

## 2015-11-17 DIAGNOSIS — K551 Chronic vascular disorders of intestine: Secondary | ICD-10-CM | POA: Diagnosis present

## 2015-11-17 DIAGNOSIS — Z833 Family history of diabetes mellitus: Secondary | ICD-10-CM | POA: Diagnosis not present

## 2015-11-17 DIAGNOSIS — Y812 Prosthetic and other implants, materials and accessory general- and plastic-surgery devices associated with adverse incidents: Secondary | ICD-10-CM | POA: Diagnosis not present

## 2015-11-17 DIAGNOSIS — T82898A Other specified complication of vascular prosthetic devices, implants and grafts, initial encounter: Secondary | ICD-10-CM

## 2015-11-17 DIAGNOSIS — I70202 Unspecified atherosclerosis of native arteries of extremities, left leg: Secondary | ICD-10-CM | POA: Diagnosis not present

## 2015-11-17 DIAGNOSIS — Z85820 Personal history of malignant melanoma of skin: Secondary | ICD-10-CM | POA: Insufficient documentation

## 2015-11-17 DIAGNOSIS — Z87891 Personal history of nicotine dependence: Secondary | ICD-10-CM | POA: Diagnosis not present

## 2015-11-17 DIAGNOSIS — Z823 Family history of stroke: Secondary | ICD-10-CM | POA: Insufficient documentation

## 2015-11-17 DIAGNOSIS — Z7984 Long term (current) use of oral hypoglycemic drugs: Secondary | ICD-10-CM | POA: Insufficient documentation

## 2015-11-17 DIAGNOSIS — I10 Essential (primary) hypertension: Secondary | ICD-10-CM | POA: Diagnosis not present

## 2015-11-17 HISTORY — PX: PERIPHERAL VASCULAR CATHETERIZATION: SHX172C

## 2015-11-17 LAB — POCT I-STAT, CHEM 8
BUN: 13 mg/dL (ref 6–20)
CALCIUM ION: 1.25 mmol/L — AB (ref 1.12–1.23)
CREATININE: 0.9 mg/dL (ref 0.44–1.00)
Chloride: 105 mmol/L (ref 101–111)
Glucose, Bld: 184 mg/dL — ABNORMAL HIGH (ref 65–99)
HCT: 33 % — ABNORMAL LOW (ref 36.0–46.0)
Hemoglobin: 11.2 g/dL — ABNORMAL LOW (ref 12.0–15.0)
Potassium: 3.6 mmol/L (ref 3.5–5.1)
Sodium: 142 mmol/L (ref 135–145)
TCO2: 24 mmol/L (ref 0–100)

## 2015-11-17 LAB — POCT ACTIVATED CLOTTING TIME
Activated Clotting Time: 213 seconds
Activated Clotting Time: 175 seconds

## 2015-11-17 LAB — GLUCOSE, CAPILLARY: Glucose-Capillary: 155 mg/dL — ABNORMAL HIGH (ref 65–99)

## 2015-11-17 SURGERY — VISCERAL ANGIOGRAPHY
Anesthesia: LOCAL

## 2015-11-17 MED ORDER — IODIXANOL 320 MG/ML IV SOLN
INTRAVENOUS | Status: DC | PRN
  Administered 2015-11-17: 65 mL via INTRAVENOUS

## 2015-11-17 MED ORDER — HEPARIN SODIUM (PORCINE) 1000 UNIT/ML IJ SOLN
INTRAMUSCULAR | Status: DC | PRN
  Administered 2015-11-17: 5000 [IU] via INTRAVENOUS
  Administered 2015-11-17: 2000 [IU] via INTRAVENOUS

## 2015-11-17 MED ORDER — SODIUM CHLORIDE 0.9 % IV SOLN: 1.0000 mL/kg/h | INTRAVENOUS | Status: DC

## 2015-11-17 MED ORDER — FENTANYL CITRATE (PF) 100 MCG/2ML IJ SOLN
INTRAMUSCULAR | Status: DC | PRN
  Administered 2015-11-17: 25 ug via INTRAVENOUS

## 2015-11-17 MED ORDER — HEPARIN (PORCINE) IN NACL 2-0.9 UNIT/ML-% IJ SOLN
INTRAMUSCULAR | Status: DC | PRN
  Administered 2015-11-17: 1000 mL via INTRA_ARTERIAL

## 2015-11-17 MED ORDER — SODIUM CHLORIDE 0.9 % IV SOLN
INTRAVENOUS | Status: DC
  Administered 2015-11-17: 08:00:00 via INTRAVENOUS

## 2015-11-17 MED ORDER — FENTANYL CITRATE (PF) 100 MCG/2ML IJ SOLN
INTRAMUSCULAR | Status: AC
  Filled 2015-11-17: qty 2

## 2015-11-17 MED ORDER — LIDOCAINE HCL (PF) 1 % IJ SOLN
INTRAMUSCULAR | Status: AC
  Filled 2015-11-17: qty 30

## 2015-11-17 MED ORDER — LIDOCAINE HCL (PF) 1 % IJ SOLN
INTRAMUSCULAR | Status: DC | PRN
  Administered 2015-11-17: 12 mL

## 2015-11-17 MED ORDER — ACETAMINOPHEN 325 MG PO TABS: 650.0000 mg | ORAL_TABLET | ORAL | Status: DC | PRN

## 2015-11-17 MED ORDER — MIDAZOLAM HCL 2 MG/2ML IJ SOLN
INTRAMUSCULAR | Status: DC | PRN
  Administered 2015-11-17: 0.5 mg via INTRAVENOUS

## 2015-11-17 MED ORDER — HEPARIN SODIUM (PORCINE) 1000 UNIT/ML IJ SOLN
INTRAMUSCULAR | Status: AC
  Filled 2015-11-17: qty 1

## 2015-11-17 MED ORDER — MIDAZOLAM HCL 2 MG/2ML IJ SOLN
INTRAMUSCULAR | Status: AC
  Filled 2015-11-17: qty 2

## 2015-11-17 MED ORDER — HEPARIN (PORCINE) IN NACL 2-0.9 UNIT/ML-% IJ SOLN
INTRAMUSCULAR | Status: AC
  Filled 2015-11-17: qty 1000

## 2015-11-17 SURGICAL SUPPLY — 17 items
BALLN VIATRAC 5X15X135 (BALLOONS) ×3
BALLOON VIATRAC 5X15X135 (BALLOONS) ×2 IMPLANT
CATH OMNI FLUSH 5F 65CM (CATHETERS) ×3 IMPLANT
COVER PRB 48X5XTLSCP FOLD TPE (BAG) ×2 IMPLANT
COVER PROBE 5X48 (BAG) ×1
DEVICE CONTINUOUS FLUSH (MISCELLANEOUS) ×3 IMPLANT
GUIDE CATH VISTA IMA 6F (CATHETERS) ×3 IMPLANT
KIT ENCORE 26 ADVANTAGE (KITS) ×3 IMPLANT
KIT MICROINTRODUCER STIFF 5F (SHEATH) ×3 IMPLANT
KIT PV (KITS) ×3 IMPLANT
SHEATH PINNACLE 6F 10CM (SHEATH) ×3 IMPLANT
STOPCOCK MORSE 400PSI 3WAY (MISCELLANEOUS) ×3 IMPLANT
SYR MEDRAD MARK V 150ML (SYRINGE) ×3 IMPLANT
TRANSDUCER W/STOPCOCK (MISCELLANEOUS) ×3 IMPLANT
TRAY PV CATH (CUSTOM PROCEDURE TRAY) ×3 IMPLANT
WIRE BENTSON .035X145CM (WIRE) ×3 IMPLANT
WIRE SPARTACORE .014X300CM (WIRE) ×3 IMPLANT

## 2015-11-17 NOTE — Progress Notes (Signed)
Site area: rt groin Site Prior to Removal:  Level 0 Pressure Applied For:  20 minutes Manual:   yes Patient Status During Pull:  stable Post Pull Site:  Level 0 Post Pull Instructions Given:  yes Post Pull Pulses Present: yes Dressing Applied:  tegaderm Bedrest begins @  1115 Comments:

## 2015-11-17 NOTE — Op Note (Addendum)
OPERATIVE NOTE   PROCEDURE: 1.  Right common femoral artery cannulation under ultrasound guidance 2.  Placement of catheter in aorta 3.  Aortogram 4.  Selection of superior mesenteric artery  5.  Angioplasty of superior mesenteric artery and stent (5 mm x 15 mm) 6.  Conscious sedation for 15 minutes  PRE-OPERATIVE DIAGNOSIS: abnormal mesenteric duplex, s/p superior mesenteric artery stenting  POST-OPERATIVE DIAGNOSIS: same as above   SURGEON: Adele Barthel, MD  ANESTHESIA: conscious sedation  ESTIMATED BLOOD LOSS: 50 cc  CONTRAST: 65 cc  FINDING(S):  Aorta: patent  Superior mesenteric artery: patent, ~30% stenosis in proximal superior mesenteric artery, ~30% in-stent restenosis, resolved after serial angioplasty; distal branches widely patent Celiac artery: patent Inferior mesenteric artery: patent, proximal stenosis <50% Widely patent bilateral renal arteries Widely patent iliac arteries except for 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 female who presents with recurrent abdominal pain and abnormal mesenteric duplex suggestive of possible SMA stent occlusion.  The patient presents for: mesenteric angiography and possible intervention.  I discussed with the patient the nature of angiographic procedures, especially the limited patencies of any endovascular intervention.  The patient is aware of that the risks of an angiographic procedure include but are not limited to: bleeding, infection, access site complications, renal failure, embolization, rupture of vessel, dissection, possible need for emergent surgical intervention, possible need for surgical procedures to treat the patient's pathology, and stroke and death.  The patient is aware of the risks and agrees to proceed.  DESCRIPTION: After full informed consent was obtained from the patient, the patient was brought back to the angiography suite.  The patient  was placed supine upon the angiography table and connected to cardiopulmonary monitoring equipment.  The patient was then given conscious sedation, the amounts of which are documented in the patient's chart.  A circulating radiologic technician maintained continuous monitoring of the patient's cardiopulmonary status.  Additionally, the control room radiologic technician provided backup monitoring throughout the procedure.  The patient was prepped and drape in the standard fashion for an angiographic procedure.  At this point, attention was turned to the right groin.  Under ultrasound guidance, the subcutaneous tissue surrounding the right common femoral artery was anesthesized with 1% lidocaine with epinephrine.  The artery was then cannulated with a micropuncture needle.  The microwire was advanced into the iliac arterial system.  The needle was exchanged for a microsheath, which was loaded into the common femoral artery over the wire.  The microwire was exchanged for a [facility] wire which was advanced into the aorta.  The microsheath was then exchanged for a 6-Fr sheath which was loaded into the common femoral artery.The Omniflush catheter was then loaded over the wire up to the level of L1.  The catheter was connected to the power injector circuit.  After de-airring and de-clotting the circuit, a power injector aortogram was completed.  A lateral aortogram was also obtained, which demonstrated the above findings. I loaded a IMA guide over the wire and selected the orifice of the superior mesenteric artery.  I did a hand injection via tuoy to verify the luminal in-stent restenosis.    I felt there was evidence of stenosis of the proximal SMA segment and in-stent restenosis in a patient that was symptomatic, so intervention was indicated.  The patient was given 7000 units of Heparin intravenously, which was a therapeutic bolus.  I exchanged the wire for a 0.014"  [place] wire which I passed into the mid-SMA.  I  loaded a 5 mm x 15 mm angioplasty balloon and inflated at 8 atm for 2 minutes in the proximal segment.  There is ~2 mm segment of unstented proximal SMA that is stenotic.  Given the SMA dissected last time, I elected to not inflate with a 6 mm balloon.  I also inflated the balloon within the lumen of the stent at 8 atm for 2 minutes.  On completion imaging, the proximal stenosis and in-stent stenosis appeared to have resolved.  I removed the balloon first and then the wire and guide.  The sheath was aspirated.  No clots were present and the sheath was reloaded with heparinized saline.  The sheath will be removed in short stay after heparin reverses adequately.   COMPLICATIONS: none  CONDITION: stable   Adele Barthel, MD [facility] Office: [phone] Pager: [phone]  11/17/2015, 10:01 AM

## 2015-11-17 NOTE — H&P (View-Only) (Signed)
Established Mesenteric Ischemia  History of Present Illness  Ariana Adams is a 70 y.o. (October 20, 1945) female who presents with chief complaint: abd pain. Pt is s/p SMA stenting on 09/29/14 by Dr. Myra Gianotti (as I gone on leave). The patient notes recurrence of abdominal pain. The sx again are atypical for CMI.  However, these sx went away after her prior SMA stenting.  Pt denies wt loss, food fear, hematochezia, or melena.  Past Medical History  Diagnosis Date  . Diabetes mellitus (HCC)   . Dizziness   . Fatigue   . SOB (shortness of breath)     Mild  . Chest pressure   . Bradycardia   . Prolonged QT interval   . Claudication (HCC)   . HTN (hypertension)     Past Surgical History  Procedure Laterality Date  . Ovary surgery    . Melanoma excision      Leg  . Breast surgery    . Peripheral vascular catheterization N/A 09/29/2014    Procedure: Visceral Angiography;  Surgeon: Nada Libman, MD;  Location: [facility] INVASIVE CV LAB;  Service: Cardiovascular;  Laterality: N/A;    Social History   Social History  . Marital Status: Married    Spouse Name: N/A  . Number of Children: 2  . Years of Education: N/A   Occupational History  . Not on file.   Social History Main Topics  . Smoking status: Former Smoker    Quit date: 09/14/2004  . Smokeless tobacco: Never Used  . Alcohol Use: No  . Drug Use: No  . Sexual Activity: Not Currently   Other Topics Concern  . Not on file   Social History Narrative    Family History  Problem Relation Age of Onset  . Stroke Mother   . Hypertension Mother   . Cancer Sister   . Diabetes Sister   . Stroke Brother   . Diabetes Daughter   . Diabetes Son     Current Outpatient Prescriptions  Medication Sig Dispense Refill  . aspirin 81 MG tablet Take 81 mg by mouth daily.    . cloNIDine (CATAPRES) 0.3 MG tablet Take 0.3 mg by mouth 2 (two) times daily.     . clopidogrel (PLAVIX) 75 MG tablet TAKE 1 TABLET BY MOUTH EVERY DAY 30 tablet  3  . diltiazem (CARDIZEM CD) 240 MG 24 hr capsule Take 240 mg by mouth daily.   2  . lisinopril-hydrochlorothiazide (PRINZIDE,ZESTORETIC) 10-12.5 MG per tablet Take 1 tablet by mouth daily.     . metFORMIN (GLUCOPHAGE) 1000 MG tablet Take 1,000 mg by mouth 2 (two) times daily with a meal.    . metoprolol succinate (TOPROL-XL) 25 MG 24 hr tablet Take 25 mg by mouth daily.   2  . pentoxifylline (TRENTAL) 400 MG CR tablet Take 400 mg by mouth 3 (three) times daily with meals.   2  . pravastatin (PRAVACHOL) 20 MG tablet     . nitroGLYCERIN (NITROSTAT) 0.4 MG SL tablet Place 1 tablet (0.4 mg total) under the tongue every 5 (five) minutes as needed for chest pain. 25 tablet 3   No current facility-administered medications for this visit.     No Known Allergies   REVIEW OF SYSTEMS:  (Positives checked otherwise negative)  CARDIOVASCULAR:   [ ]  chest pain,  [ ]  chest pressure,  [ ]  palpitations,  [ ]  shortness of breath when laying flat,  [ ]  shortness of breath with exertion,   [ ]   pain in feet when walking,   pain in feet when laying flat,  history of blood clot in veins (DVT),   history of phlebitis,   swelling in legs,   varicose veins  PULMONARY:    productive cough,   asthma,   wheezing  NEUROLOGIC:    weakness in arms or legs,   numbness in arms or legs,   difficulty speaking or slurred speech,   temporary loss of vision in one eye,   dizziness  HEMATOLOGIC:    bleeding problems,   problems with blood clotting too easily  MUSCULOSKEL:    joint pain,  joint swelling  GASTROINTEST:    vomiting blood,   blood in stool     GENITOURINARY:    burning with urination,   blood in urine  PSYCHIATRIC:    history of major depression  INTEGUMENTARY:    rashes,   ulcers  CONSTITUTIONAL:    fever,   chills    Physical Examination Filed Vitals:   11/11/15 0904 11/11/15 0908  BP:  159/82 148/78  Pulse: 60 60  Temp: 97 F (36.1 C)   TempSrc: Oral   Resp: 16   Height:  (1.651 m)   Weight: 154 lb (69.854 kg)   SpO2: 66%     Body mass index is 25.63 kg/(m^2).  General: A&O x 3, WDWN  Pulmonary: non-labor breathing, no wheezing  Cardiac: RRR, Nl S1, S2, no Murmurs, rubs or gallops  Vascular: Vessel Right Left  Radial Palpable Palpable  Brachial Palpable Palpable  Carotid Palpable, without bruit Palpable, without bruit  Aorta Not palpable N/A  Femoral Palpable Palpable  Popliteal Not palpable Not palpable  PT Palpable Palpable  DP Palpable Palpable    Gastrointestinal: NAS, soft, NTND, no G/R, no HSM, no masses, no CVAT B  Non-Invasive Vascular Imaging  Mesenteric Duplex (11/11/2015 ):   Ao: 65 c/s  Celiac artery: 335 c/s   SMA: 30 c/s, unable to visualize color flow in SMA   Medical Decision Making  Ariana Adams is a 70 y.o. 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  Based on her exam and studies, I have offered the patient: mesenteric angiography, possible SMA PTA.  This is scheduled for 27 JUL 17. I discussed with the patient the nature of angiographic procedures, especially the limited patencies of any endovascular intervention.   The patient is aware of that the risks of an angiographic procedure include but are not limited to: bleeding, infection, access site complications, renal failure, embolization, rupture of vessel, dissection, arteriovenous fistula, possible need for emergent surgical intervention, possible need for surgical procedures to treat the patient's pathology, anaphylactic reaction to contrast, and stroke and death.    The patient is aware of the risks and agrees to proceed.  I discussed in depth with the patient the nature of atherosclerosis, and emphasized the importance of maximal medical management  including strict control of blood pressure, blood glucose, and lipid levels, obtaining regular exercise, and cessation of smoking.   The patient is aware that without maximal medical management the underlying atherosclerotic disease process will progress, limiting the benefit of any interventions.  The patient is currently on a statin: Pravastatin.  The patient is currently on an anti-platelet: Plavix and  ASA. I told the patient she could drop ASA, as there is no need for double platelet agents.  Thank you for allowing [place] to participate in this patient's care.  Leonides Sake, MD [facility] [place] Office: [phone] Pager: [phone]

## 2015-11-17 NOTE — Interval H&P Note (Signed)
[facility]  History and Physical Update  The patient was interviewed and re-examined.  The patient's previous History and Physical has been reviewed and is unchanged from my consult.  There is no change in the plan of care: mesenteric angiogram, possible SMA intervention.   I discussed with the patient the nature of angiographic procedures, especially the limited patencies of any endovascular intervention.    The patient is aware of that the risks of an angiographic procedure include but are not limited to: bleeding, infection, access site complications, renal failure, embolization, rupture of vessel, dissection, arteriovenous fistula, possible need for emergent surgical intervention, possible need for surgical procedures to treat the patient's pathology, anaphylactic reaction to contrast, and stroke and death.    The patient is aware of the risks and agrees to proceed.   Leonides Sake, MD [facility] [place] Office: [phone] Pager: [phone]  11/17/2015, 7:13 AM

## 2015-11-17 NOTE — Discharge Instructions (Signed)
Angiogram, Care After °Refer to this sheet in the next few weeks. These instructions provide you with information about caring for yourself after your procedure. Your health care provider may also give you more specific instructions. Your treatment has been planned according to current medical practices, but problems sometimes occur. Call your health care provider if you have any problems or questions after your procedure. °WHAT TO EXPECT AFTER THE PROCEDURE °After your procedure, it is typical to have the following: °· Bruising at the catheter insertion site that usually fades within 1-2 weeks. °· Blood collecting in the tissue (hematoma) that may be painful to the touch. It should usually decrease in size and tenderness within 1-2 weeks. °HOME CARE INSTRUCTIONS °· Take medicines only as directed by your health care provider. °· You may shower 24-48 hours after the procedure or as directed by your health care provider. Remove the bandage (dressing) and gently wash the site with plain soap and water. Pat the area dry with a clean towel. Do not rub the site, because this may cause bleeding. °· Do not take baths, swim, or use a hot tub until your health care provider approves. °· Check your insertion site every day for redness, swelling, or drainage. °· Do not apply powder or lotion to the site. °· Do not lift over 10 lb (4.5 kg) for 5 days after your procedure or as directed by your health care provider. °· Ask your health care provider when it is okay to: °¨ Return to work or school. °¨ Resume usual physical activities or sports. °¨ Resume sexual activity. °· Do not drive home if you are discharged the same day as the procedure. Have someone else drive you. °· You may drive 24 hours after the procedure unless otherwise instructed by your health care provider. °· Do not operate machinery or power tools for 24 hours after the procedure or as directed by your health care provider. °· If your procedure was done as an  outpatient procedure, which means that you went home the same day as your procedure, a responsible adult should be with you for the first 24 hours after you arrive home. °· Keep all follow-up visits as directed by your health care provider. This is important. °SEEK MEDICAL CARE IF: °· You have a fever. °· You have chills. °· You have increased bleeding from the catheter insertion site. Hold pressure on the site. °SEEK IMMEDIATE MEDICAL CARE IF: °· You have unusual pain at the catheter insertion site. °· You have redness, warmth, or swelling at the catheter insertion site. °· You have drainage (other than a small amount of blood on the dressing) from the catheter insertion site. °· The catheter insertion site is bleeding, and the bleeding does not stop after 30 minutes of holding steady pressure on the site. °· The area near or just beyond the catheter insertion site becomes pale, cool, tingly, or numb. °  °This information is not intended to replace advice given to you by your health care provider. Make sure you discuss any questions you have with your health care provider. °  °Document Released: 10/26/2004 Document Revised: 04/30/2014 Document Reviewed: 09/10/2012 °[employer]vier Interactive Patient Education ©2016 [employer] ° °

## 2015-11-18 ENCOUNTER — Encounter (HOSPITAL_COMMUNITY): Payer: Self-pay | Admitting: Vascular Surgery

## 2015-12-19 ENCOUNTER — Other Ambulatory Visit: Payer: Self-pay | Admitting: *Deleted

## 2015-12-19 DIAGNOSIS — I739 Peripheral vascular disease, unspecified: Secondary | ICD-10-CM

## 2015-12-19 DIAGNOSIS — Z48812 Encounter for surgical aftercare following surgery on the circulatory system: Secondary | ICD-10-CM

## 2015-12-20 ENCOUNTER — Encounter: Payer: Self-pay | Admitting: Vascular Surgery

## 2015-12-23 ENCOUNTER — Ambulatory Visit (HOSPITAL_COMMUNITY)
Admission: RE | Admit: 2015-12-23 | Discharge: 2015-12-23 | Disposition: A | Discharge status: Home / Self Care | Payer: Medicare Other | Source: Ambulatory Visit | Attending: Vascular Surgery | Admitting: Vascular Surgery

## 2015-12-23 ENCOUNTER — Ambulatory Visit: Payer: Medicare Other | Admitting: Vascular Surgery

## 2015-12-23 DIAGNOSIS — I739 Peripheral vascular disease, unspecified: Secondary | ICD-10-CM | POA: Diagnosis not present

## 2015-12-23 DIAGNOSIS — I10 Essential (primary) hypertension: Secondary | ICD-10-CM | POA: Diagnosis not present

## 2015-12-23 DIAGNOSIS — R938 Abnormal findings on diagnostic imaging of other specified body structures: Secondary | ICD-10-CM | POA: Insufficient documentation

## 2015-12-23 DIAGNOSIS — Z48812 Encounter for surgical aftercare following surgery on the circulatory system: Secondary | ICD-10-CM | POA: Insufficient documentation

## 2015-12-23 DIAGNOSIS — E119 Type 2 diabetes mellitus without complications: Secondary | ICD-10-CM | POA: Diagnosis not present

## 2015-12-27 NOTE — Progress Notes (Addendum)
Postoperative Visit   History of Present Illness  Ariana Adams is a 70 y.o. female who presents for postoperative follow-up from procedure on Date: 11/17/15: PTA SMA and stent.  The patient's current symptoms are: continued abdominal pain.  She denies post-prandial pain and weight loss.  The patient also returns for screening BLE ABI.  The patient has ~1 block L calf claudication without rest pain  Past Medical History, Past Surgical History, Social History, Family History, Medications, Allergies, and Review of Systems are unchanged from previous evaluation on 11/17/15.  For VQI Use Only  PRE-ADM LIVING: Home  AMB STATUS: Ambulatory  Current Outpatient Prescriptions  Medication Sig Dispense Refill  . cloNIDine (CATAPRES) 0.3 MG tablet Take 0.3 mg by mouth 2 (two) times daily.     . clopidogrel (PLAVIX) 75 MG tablet TAKE 1 TABLET BY MOUTH EVERY DAY (Patient taking differently: TAKE 1 TABLET (75 mg) BY MOUTH EVERY DAY) 30 tablet 3  . diltiazem (CARDIZEM CD) 240 MG 24 hr capsule Take 240 mg by mouth daily.   2  . lisinopril-hydrochlorothiazide (PRINZIDE,ZESTORETIC) 10-12.5 MG per tablet Take 1 tablet by mouth daily.     . metFORMIN (GLUCOPHAGE) 1000 MG tablet Take 1,000 mg by mouth 2 (two) times daily with a meal.    . metoprolol succinate (TOPROL-XL) 25 MG 24 hr tablet Take 25 mg by mouth daily.   2  . naproxen sodium (ANAPROX) 220 MG tablet Take 220 mg by mouth daily as needed (for pain).    . nitroGLYCERIN (NITROSTAT) 0.4 MG SL tablet Place 1 tablet (0.4 mg total) under the tongue every 5 (five) minutes as needed for chest pain. 25 tablet 3  . pentoxifylline (TRENTAL) 400 MG CR tablet Take 400 mg by mouth 3 (three) times daily with meals.   2  . pravastatin (PRAVACHOL) 20 MG tablet Take 20 mg by mouth daily.      No current facility-administered medications for this visit.     Physical Examination  There were no vitals filed for this visit. There is no height or weight on file  to calculate BMI.  General: A&O x 3, WDWN  Pulmonary: Sym exp, good air movt, CTAB, no rales, rhonchi, & wheezing  Cardiac: RRR, Nl S1, S2, no Murmurs, rubs or gallops  Vascular: Vessel Right Left  Radial Palpable Palpable  Brachial Palpable Palpable  Carotid Palpable, without bruit Palpable, without bruit  Aorta Not palpable N/A  Femoral Palpable Palpable  Popliteal Not palpable Not palpable  PT Palpable Not Palpable  DP Not  Palpable Not Palpable    Gastrointestinal: soft, NTND, -G/R, - HSM, - masses, - CVAT B  Musculoskeletal: M/S 5/5 throughout , Extremities without ischemic changes , R groin without hematoma, no echymosis present at cannulation site  Neurologic:  Pain and light touch intact in extremities , Motor exam as listed above  ABI (Date: 12/27/2015)  R:   ABI: 1.05,   DP: mono  PT: tri  TBI: 0.56  L:   ABI: 0.82,   DP: mono  PT: mono  TBI: ND   Medical Decision Making  Ariana Adams is a 70 y.o. female who presents s/p prior PTA+S SMA with repeat PTA for in-stent restenosis, BLE PAD, LLE intermittent claudication . This patient's continued abd pain despite patent SMA stent again suggests that her abd sx are not related to mesenteric ischemia. I started the patient on the walking plan and Pletal 100 mg PO bid for her intermittent claudication . Based  on his angiographic findings, this patient needs: q6 mon ABI, mesenteric duplex. I discussed in depth with the patient the nature of atherosclerosis, and emphasized the importance of maximal medical management including strict control of blood pressure, blood glucose, and lipid levels, obtaining regular exercise, and cessation of smoking.  The patient is aware that without maximal medical management the underlying atherosclerotic disease process will progress, limiting the benefit of any interventions. The patient is currently on a statin: Pravachol. The patient is currently on an anti-platelet:  Plavix.  Thank you for allowing us to participate in this patient's care.  Leonides SakeBrian Vieva Brummitt, MD, FACS [facility] [place] Office: [phone] Pager: [phone]

## 2015-12-30 ENCOUNTER — Encounter: Payer: Self-pay | Admitting: Vascular Surgery

## 2015-12-30 ENCOUNTER — Ambulatory Visit (INDEPENDENT_AMBULATORY_CARE_PROVIDER_SITE_OTHER): Payer: Medicare Other | Admitting: Vascular Surgery

## 2015-12-30 DIAGNOSIS — I70219 Atherosclerosis of native arteries of extremities with intermittent claudication, unspecified extremity: Secondary | ICD-10-CM | POA: Insufficient documentation

## 2015-12-30 DIAGNOSIS — I70212 Atherosclerosis of native arteries of extremities with intermittent claudication, left leg: Secondary | ICD-10-CM

## 2015-12-30 MED ORDER — CILOSTAZOL 100 MG PO TABS
100.0000 mg | ORAL_TABLET | Freq: Two times a day (BID) | ORAL | 11 refills | Status: AC
Start: 2015-12-30 — End: ?

## 2016-01-19 ENCOUNTER — Other Ambulatory Visit: Payer: Self-pay

## 2016-01-19 DIAGNOSIS — I70219 Atherosclerosis of native arteries of extremities with intermittent claudication, unspecified extremity: Secondary | ICD-10-CM

## 2016-01-19 DIAGNOSIS — K551 Chronic vascular disorders of intestine: Secondary | ICD-10-CM

## 2016-01-19 MED ORDER — CLOPIDOGREL BISULFATE 75 MG PO TABS: 75.0000 mg | ORAL_TABLET | Freq: Every day | ORAL | 3 refills | Status: DC

## 2016-03-27 NOTE — Addendum Note (Signed)
Addended by: Burton ApleyPETTY, Deyonna Fitzsimmons A on: 03/27/2016 09:50 AM   Modules accepted: Orders

## 2016-05-11 ENCOUNTER — Other Ambulatory Visit: Payer: Self-pay

## 2016-05-11 DIAGNOSIS — K551 Chronic vascular disorders of intestine: Secondary | ICD-10-CM

## 2016-05-11 DIAGNOSIS — I70219 Atherosclerosis of native arteries of extremities with intermittent claudication, unspecified extremity: Secondary | ICD-10-CM

## 2016-05-11 MED ORDER — CLOPIDOGREL BISULFATE 75 MG PO TABS: 75.0000 mg | ORAL_TABLET | Freq: Every day | ORAL | 3 refills | Status: AC

## 2016-06-22 ENCOUNTER — Encounter: Payer: Self-pay | Admitting: Vascular Surgery

## 2016-06-25 NOTE — Progress Notes (Signed)
Mesenteric Ischemia / Intermittent claudication    History of Present Illness  Ariana Adams is a 71 y.o. (03/17/1946) female s/p SMA stent who presents with cc: surveillance.  Prior procedures include: 1.  PTA SMA stent (11/17/15) 2. SMA PTA+S by Dr. Myra Gianotti (09/29/14)  The patient's current symptoms are: mild continued abdominal pain.  She denies post-prandial pain and weight loss.  Pt notes some mild intermittent claudication without rest pain  Past Medical History, Past Surgical History, Social History, Family History, Medications, Allergies, and Review of Systems are unchanged from previous evaluation on 12/30/15  Current Outpatient Prescriptions  Medication Sig Dispense Refill  . cilostazol (PLETAL) 100 MG tablet Take 1 tablet (100 mg total) by mouth 2 (two) times daily before a meal. 60 tablet 11  . cloNIDine (CATAPRES) 0.3 MG tablet Take 0.3 mg by mouth 2 (two) times daily.     . clopidogrel (PLAVIX) 75 MG tablet Take 1 tablet (75 mg total) by mouth daily. 30 tablet 3  . diltiazem (CARDIZEM CD) 240 MG 24 hr capsule Take 240 mg by mouth daily.   2  . lisinopril-hydrochlorothiazide (PRINZIDE,ZESTORETIC) 10-12.5 MG per tablet Take 1 tablet by mouth daily.     . metFORMIN (GLUCOPHAGE) 1000 MG tablet Take 1,000 mg by mouth 2 (two) times daily with a meal.    . metoprolol succinate (TOPROL-XL) 25 MG 24 hr tablet Take 25 mg by mouth daily.   2  . naproxen sodium (ANAPROX) 220 MG tablet Take 220 mg by mouth daily as needed (for pain).    . nitroGLYCERIN (NITROSTAT) 0.4 MG SL tablet Place 1 tablet (0.4 mg total) under the tongue every 5 (five) minutes as needed for chest pain. 25 tablet 3  . pentoxifylline (TRENTAL) 400 MG CR tablet Take 400 mg by mouth 3 (three) times daily with meals.   2  . pravastatin (PRAVACHOL) 20 MG tablet Take 20 mg by mouth daily.      No current facility-administered medications for this visit.     Physical Examination  There were no vitals filed  for this visit.  There is no height or weight on file to calculate BMI.  General: A&O x 3, WDWN  Pulmonary: Sym exp, good air movt, CTAB, no rales, rhonchi, & wheezing  Cardiac: RRR, Nl S1, S2, no Murmurs, rubs or gallops  Vascular: Vessel Right Left  Radial Palpable Palpable  Brachial Palpable Palpable  Carotid Palpable, without bruit Palpable, without bruit  Aorta Not palpable N/A  Femoral Palpable Palpable  Popliteal Not palpable Not palpable  PT Palpable Not Palpable  DP Faintly palpable Faintly palpable   Gastrointestinal: soft, NTND, -G/R, - HSM, - masses, - CVAT B  Musculoskeletal: M/S 5/5 throughout , Extremities without ischemic changes   Neurologic:  Pain and light touch intact in extremities , Motor exam as listed above  Mesenteric duplex (06/25/2016)  SMA: 134-370 c/s (elevated velocities felt due to angulation)  CA: 138 c/s  Ao: 52 c/s   ABI (Date: 06/25/2016)  R:   ABI: 0.99 (1.05),   DP: tri  PT: tri  TBI:  0.54  L:   ABI: 0.73 (0.82),   DP: bi  PT: bi  TBI: 0.54   Medical Decision Making  Jerlean Peralta is a 71 y.o. ([birth year]) female who presents s/p prior PTA+S SMA with repeat PTA for in-stent restenosis, BLE PAD, LLE intermittent claudication .  This patient's continued abd pain despite patent SMA stent again suggests that her abd sx are  not related to mesenteric ischemia.  I started the patient on the walking plan and Pletal 100 mg PO bid for her intermittent claudication .  Based on his angiographic findings, this patient needs: q6 mon ABI, mesenteric duplex.  I discussed in depth with the patient the nature of atherosclerosis, and emphasized the importance of maximal medical management including strict control of blood pressure, blood glucose, and lipid levels, obtaining regular exercise, and cessation of smoking.  The patient is aware that without maximal medical management the underlying atherosclerotic disease process  will progress, limiting the benefit of any interventions.  The patient is currently on a statin: Pravachol.  The patient is currently on an anti-platelet: Plavix.  Thank you for allowing us to participate in this patient's care.   Leonides SakeBrian Lillyrose Reitan, MD, FACS [facility] [place] Office: [phone] Pager: [phone]

## 2016-06-29 ENCOUNTER — Ambulatory Visit (HOSPITAL_COMMUNITY)
Admission: RE | Admit: 2016-06-29 | Discharge: 2016-06-29 | Disposition: A | Discharge status: Home / Self Care | Payer: Medicare Other | Source: Ambulatory Visit | Attending: Vascular Surgery | Admitting: Vascular Surgery

## 2016-06-29 ENCOUNTER — Ambulatory Visit (INDEPENDENT_AMBULATORY_CARE_PROVIDER_SITE_OTHER): Payer: Medicare Other | Admitting: Vascular Surgery

## 2016-06-29 ENCOUNTER — Ambulatory Visit (INDEPENDENT_AMBULATORY_CARE_PROVIDER_SITE_OTHER)
Admission: RE | Admit: 2016-06-29 | Discharge: 2016-06-29 | Disposition: A | Discharge status: Home / Self Care | Payer: Medicare Other | Source: Ambulatory Visit | Attending: Vascular Surgery | Admitting: Vascular Surgery

## 2016-06-29 ENCOUNTER — Encounter: Payer: Self-pay | Admitting: Vascular Surgery

## 2016-06-29 VITALS — BP 140/75 | HR 65 | Temp 97.1°F | Resp 18 | Ht 65.0 in | Wt 152.7 lb

## 2016-06-29 DIAGNOSIS — Z95828 Presence of other vascular implants and grafts: Secondary | ICD-10-CM | POA: Diagnosis present

## 2016-06-29 DIAGNOSIS — R109 Unspecified abdominal pain: Secondary | ICD-10-CM | POA: Diagnosis present

## 2016-06-29 DIAGNOSIS — K551 Chronic vascular disorders of intestine: Secondary | ICD-10-CM | POA: Diagnosis not present

## 2016-06-29 DIAGNOSIS — I70212 Atherosclerosis of native arteries of extremities with intermittent claudication, left leg: Secondary | ICD-10-CM | POA: Insufficient documentation

## 2016-06-29 DIAGNOSIS — I70213 Atherosclerosis of native arteries of extremities with intermittent claudication, bilateral legs: Secondary | ICD-10-CM

## 2016-06-29 DIAGNOSIS — Z9889 Other specified postprocedural states: Secondary | ICD-10-CM | POA: Insufficient documentation

## 2016-07-05 NOTE — Addendum Note (Signed)
Addended by: Burton ApleyPETTY, Ishaq Maffei A on: 07/05/2016 09:45 AM   Modules accepted: Orders

## 2016-09-23 IMAGING — CT CT ANGIO CHEST
1 of 4 series · 17 of 42 positions shown · IV contrast (APPLIED)
Comparison: CT scan of January 21, 2012. CT scan of July 27, 2014. Ultrasound August 03, 2014.

CLINICAL DATA: Thoracic aorta atherosclerosis.

EXAM:
CT ANGIOGRAPHY CHEST, ABDOMEN AND PELVIS
TECHNIQUE: Multidetector CT imaging through the chest, abdomen and pelvis was
performed using the standard protocol during bolus administration of
intravenous contrast. Multiplanar reconstructed images and MIPs were
obtained and reviewed to evaluate the vascular anatomy.
CONTRAST:  80mL OMNIPAQUE IOHEXOL 350 MG/ML SOLN

[Series 8: angio cap 3.0 b31s · axial · 0.66mm/px · z∈[-659,-62]mm · 17 of 223 slices shown]
[im 12/223  lung]
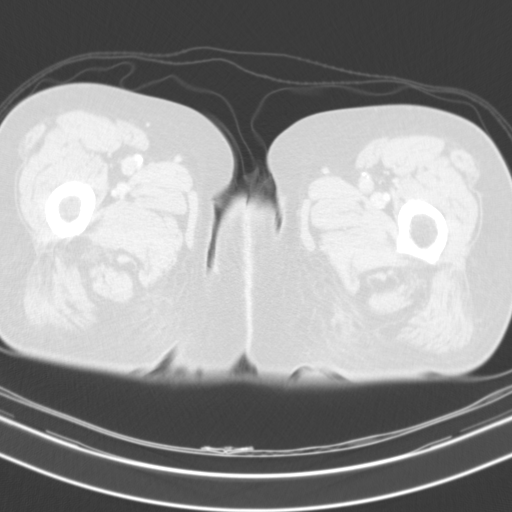
[im 23/223  soft-tissue]
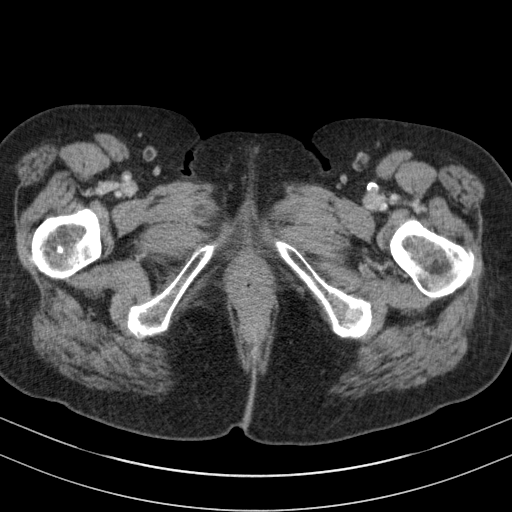
[im 34/223  lung]
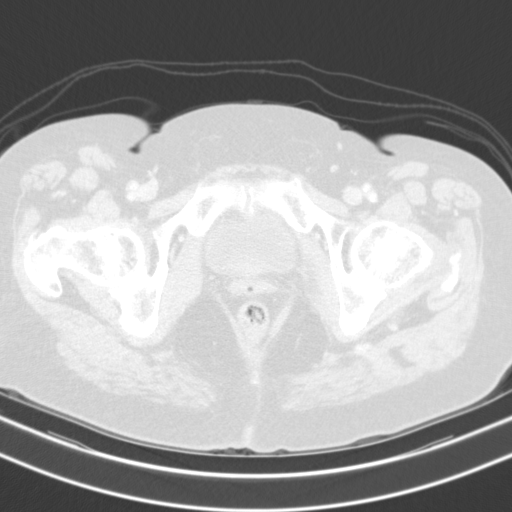
[im 45/223  soft-tissue]
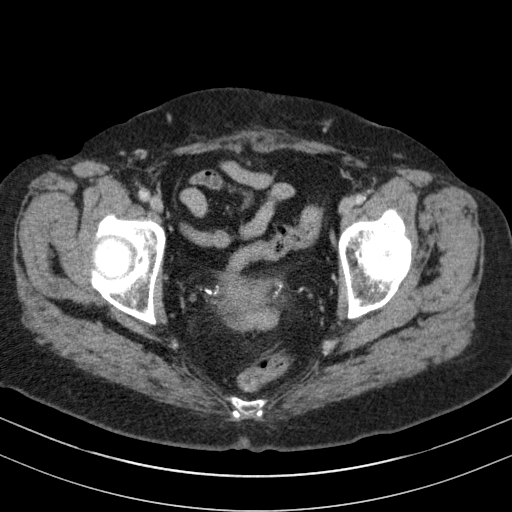
[im 67/223  lung]
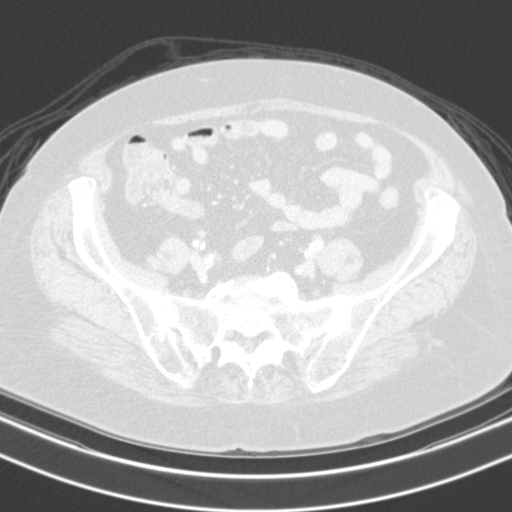
[im 78/223  soft-tissue]
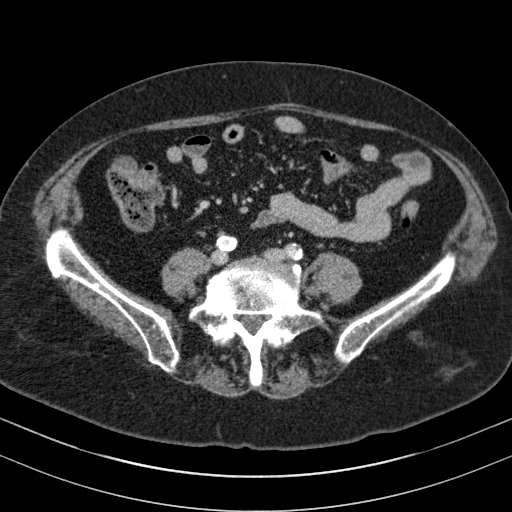
[im 89/223  lung]
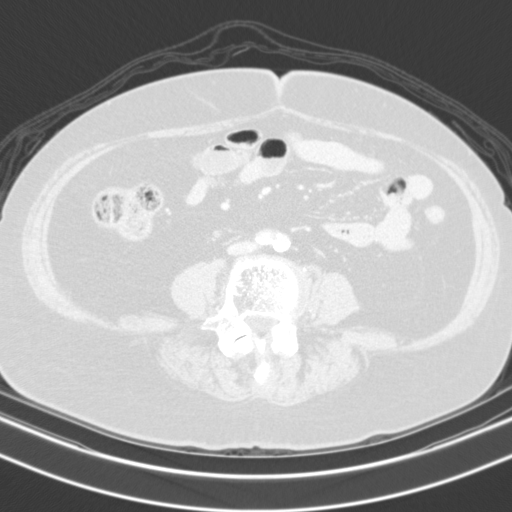
[im 100/223  soft-tissue]
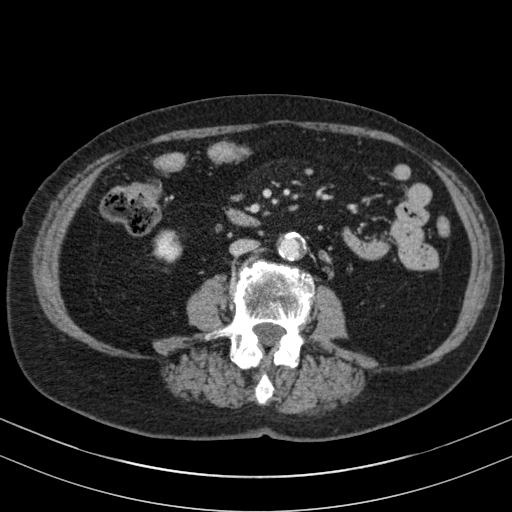
[im 112/223  lung]
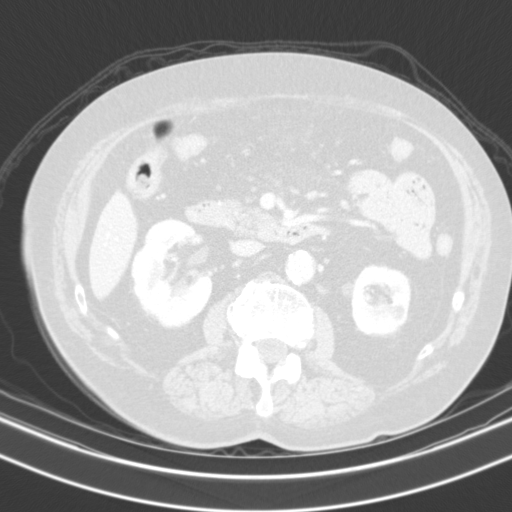
[im 123/223  soft-tissue]
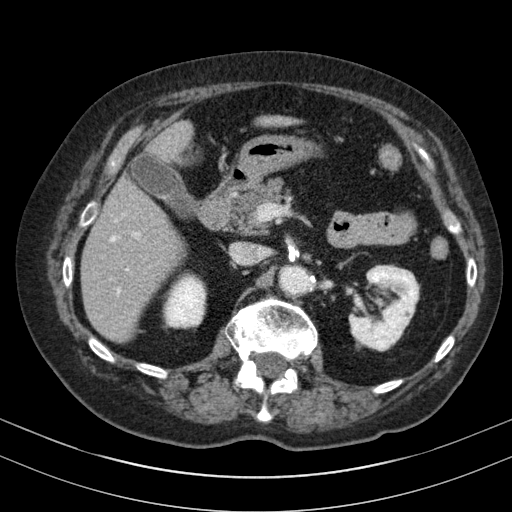
[im 134/223  lung]
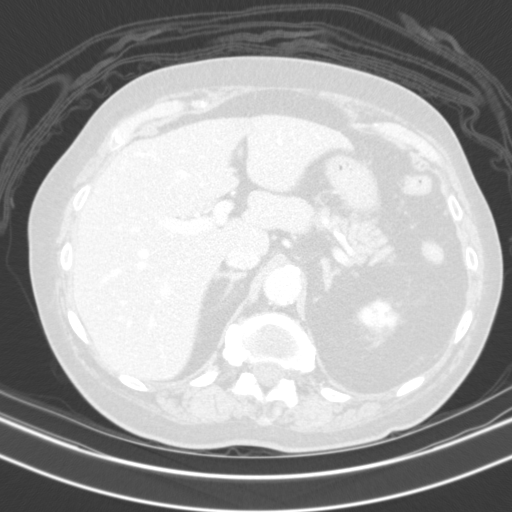
[im 145/223  soft-tissue]
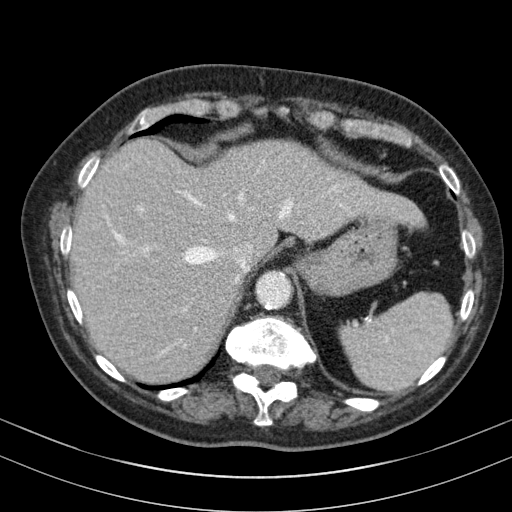
[im 156/223  lung]
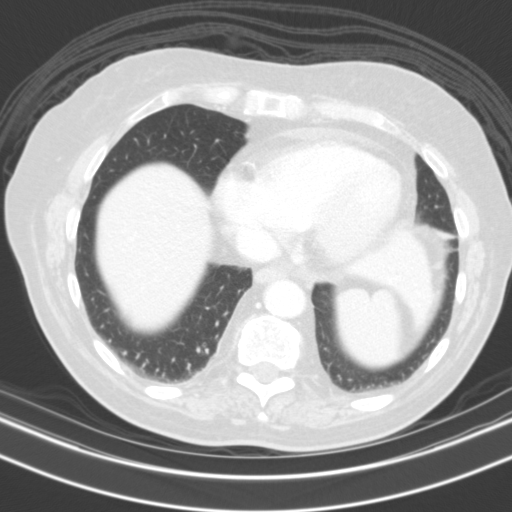
[im 178/223  soft-tissue]
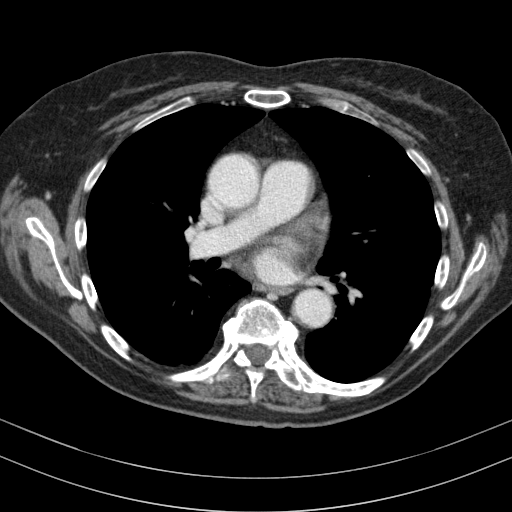
[im 189/223  lung]
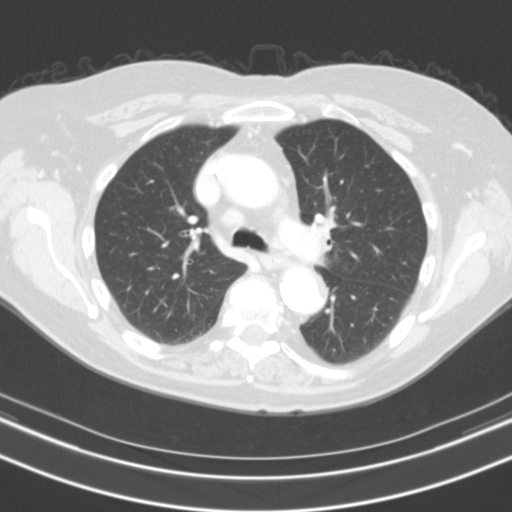
[im 200/223  soft-tissue]
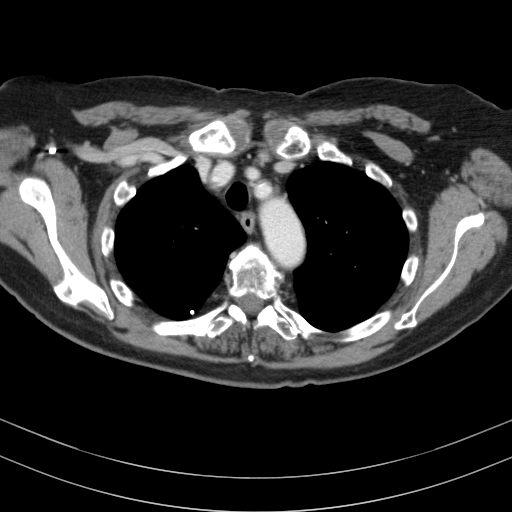
[im 211/223  lung]
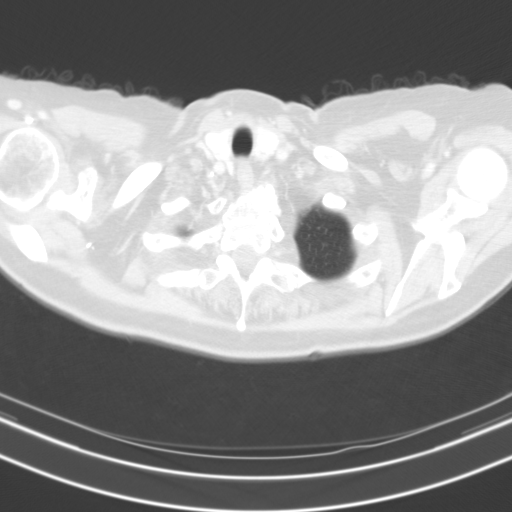

[17 of 42 positions shown; findings below may reference images not displayed]

FINDINGS: CTA CHEST FINDINGS

No pneumothorax or pleural effusion is noted. No acute pulmonary
disease is noted. Mild multilevel degenerative disc disease is noted
throughout the thoracic spine. Atherosclerosis of thoracic aorta is
noted without aneurysm or dissection. Enjoyav junction measures
3.2 cm in diameter. Ascending thoracic aorta measures 3.8 cm in
diameter. Distal portion of transverse aortic arch measures 3.0 cm.
Distal descending thoracic aorta measures 2.6 cm. Great vessels are
widely patent without significant stenosis. 11 mm low density is
noted in left thyroid lobe which is not significantly changed
compared to prior exam. Minimal coronary artery calcifications are
noted. 15 x 12 mm precarinal lymph node is noted which is unchanged
compared to prior exam and therefore most likely benign.

Review of the MIP images confirms the above findings.

CTA ABDOMEN AND PELVIS FINDINGS

No gallstones are noted. The liver, spleen and pancreas appear
normal. Adrenal glands appear normal. No hydronephrosis or renal
obstruction is noted. No renal or ureteral calculi are noted. 2.2 cm
left renal cyst is noted. There is no evidence of bowel obstruction.
4.2 x 3.6 cm cystic lesion is noted in left adnexal region which is
not significantly changed compared to prior exam. Urinary bladder
appears normal. No evidence of bowel obstruction is noted.

Atherosclerosis of abdominal aorta is noted without evidence of
aneurysm or dissection. Atherosclerosis of iliac arteries is noted
as well. There is no evidence of significant renal artery stenosis.
Calcified plaque is seen involving the origin of the celiac artery
resulting in moderate stenosis in poststenotic dilatation. Heavily
calcified plaque is noted at the origin of the superior mesenteric
artery resulting in severe stenosis and reduced caliber of more
distal artery. Inferior mesenteric artery is widely patent.

Review of the MIP images confirms the above findings.
IMPRESSION: No evidence of thoracic aortic aneurysm or dissection.

11 mm low density is noted in left thyroid lobe which is unchanged
compared to prior exam.

Atherosclerosis of abdominal aorta is noted without aneurysm or
dissection. No evidence of significant renal artery stenosis is
noted. Moderate stenosis is noted at the origin of the celiac artery
with poststenotic dilatation secondary to calcified plaque. Severe
stenosis is noted at the origin of the superior mesenteric artery
secondary to heavily calcified plaque formation. Potentially this
may represent chronic mesenteric ischemia.

4.2 cm cystic lesion seen in left adnexal region as described on
prior ultrasound August 03, 2014.

## 2017-01-01 NOTE — Progress Notes (Deleted)
Established Intermittent Claudication & Mesenteric Ischemia   History of Present Illness   Ariana Adams is a 71 y.o. (March 20, 1946) female who presents with chief complaint: ***.    Prior procedures include: 1.  PTA SMA stent (11/17/15) 2. SMA PTA+S by Dr. Myra Gianotti (09/29/14)  The patient's symptoms have *** progressed.  The patient's symptoms are: ***.  The patient's treatment regimen currently included: maximal medical management and ***walking plan.  The patient's PMH, PSH, and SH, and FamHx are unchanged from ***.  Current Outpatient Prescriptions  Medication Sig Dispense Refill  . cilostazol (PLETAL) 100 MG tablet Take 1 tablet (100 mg total) by mouth 2 (two) times daily before a meal. 60 tablet 11  . cloNIDine (CATAPRES) 0.3 MG tablet Take 0.3 mg by mouth 2 (two) times daily.     . clopidogrel (PLAVIX) 75 MG tablet Take 1 tablet (75 mg total) by mouth daily. 30 tablet 3  . diltiazem (CARDIZEM CD) 240 MG 24 hr capsule Take 240 mg by mouth daily.   2  . lisinopril-hydrochlorothiazide (PRINZIDE,ZESTORETIC) 10-12.5 MG per tablet Take 1 tablet by mouth daily.     . metFORMIN (GLUCOPHAGE) 1000 MG tablet Take 1,000 mg by mouth 2 (two) times daily with a meal.    . metoprolol succinate (TOPROL-XL) 25 MG 24 hr tablet Take 25 mg by mouth daily.   2  . naproxen sodium (ANAPROX) 220 MG tablet Take 220 mg by mouth daily as needed (for pain).    . nitroGLYCERIN (NITROSTAT) 0.4 MG SL tablet Place 1 tablet (0.4 mg total) under the tongue every 5 (five) minutes as needed for chest pain. 25 tablet 3  . pentoxifylline (TRENTAL) 400 MG CR tablet Take 400 mg by mouth 3 (three) times daily with meals.   2  . pravastatin (PRAVACHOL) 20 MG tablet Take 20 mg by mouth daily.      No current facility-administered medications for this visit.     On ROS today: ***, ***.   Physical Examination  ***There were no vitals filed for this visit. ***There is no height or weight on file to calculate  BMI.  General {LOC:19197::"Somulent","Alert"}, {Orientation:19197::"Confused","O x 3"}, {Weight:19197::"Obese","Cachectic","WD"}, {General state of health:19197::"Ill appearing","Elderly","NAD"}  Pulmonary {Chest wall:19197::"Asx chest movement","Sym exp"}, {Air movt:19197::"Decreased *** air movt","good B air movt"}, {BS:19197::"rales on ***","rhonchi on ***","wheezing on ***","CTA B"}  Cardiac {Rhythm:19197::"Irregularly, irregular rate and rhythm","RRR, Nl S1, S2"}, {Murmur:19197::"Murmur present: ***","no Murmurs"}, {Rubs:19197::"Rub present: ***","No rubs"}, {Gallop:19197::"Gallop present: ***","No S3,S4"}  Vascular Vessel Right Left  Radial {Palpable:19197::"Not palpable","Faintly palpable","Palpable"} {Palpable:19197::"Not palpable","Faintly palpable","Palpable"}  Brachial {Palpable:19197::"Not palpable","Faintly palpable","Palpable"} {Palpable:19197::"Not palpable","Faintly palpable","Palpable"}  Carotid Palpable, {Bruit:19197::"Bruit present","No Bruit"} Palpable, {Bruit:19197::"Bruit present","No Bruit"}  Aorta Not palpable N/A  Femoral {Palpable:19197::"Not palpable","Faintly palpable","Palpable"} {Palpable:19197::"Not palpable","Faintly palpable","Palpable"}  Popliteal {Palpable:19197::"Prominently palpable","Not palpable"} {Palpable:19197::"Prominently palpable","Not palpable"}  PT {Palpable:19197::"Not palpable","Faintly palpable","Palpable"} {Palpable:19197::"Not palpable","Faintly palpable","Palpable"}  DP {Palpable:19197::"Not palpable","Faintly palpable","Palpable"} {Palpable:19197::"Not palpable","Faintly palpable","Palpable"}    Gastro- intestinal soft, {Distension:19197::"distended","non-distended"}, {TTP:19197::"TTP in *** quadrant","appropriate tenderness to palpation","non-tender to palpation"}, {Guarding:19197::"Guarding and rebound in *** quadrant","No guarding or rebound"}, {HSM:19197::"HSM present","no HSM"}, {Masses:19197::"Mass present: ***","no masses"},  {Flank:19197::"CVAT on ***","Flank bruit present on ***","no CVAT B"}, {AAA:19197::"Palpable prominent aortic pulse","No palpable prominent aortic pulse"}, {Surgical incision:19197::"Surg incisions: ***","Surgical incisions well healed"," "}  Musculo- skeletal M/S 5/5 throughout {MS:19197::"except ***"," "}, Extremities without ischemic changes {MS:19197::"except ***"," "}, {Edema:19197::"Pitting edema present: ***","Non-pitting edema present: ***","No edema present"}, {Varicosities:19197::"Varicosities present: ***","No visible varicosities "}, {LDS:19197::"Lipodermatosclerosis present: ***","No Lipodermatosclerosis present"}  Neurologic Pain and light touch intact in extremities{CN:19197::" except for decreased sensation in ***"," "},  Motor exam as listed above    Non-Invasive Vascular imaging   ABI (***)  R:   ABI: *** (0.99),   PT: {Signals:19197::"none","mono","bi","tri"}  DP: {Signals:19197::"none","mono","bi","tri"}  TBI:  ***  L:   ABI: *** (0.73),   PT: {Signals:19197::"none","mono","bi","tri"}  DP: {Signals:19197::"none","mono","bi","tri"}  TBI: ***  Aortoiliac Duplex (***)  Ao: *** c/s  R iliac: *** c/s  L iliac: *** c/s   Medical Decision Making   Ariana Adams is a 71 y.o. female who presents with:  s/p prior PTA+S SMA with repeat PTA for in-stent restenosis, BLE PAD, LLE intermittent claudication   Based on the patient's vascular studies and examination, I have offered the patient: ***.  I discussed in depth with the patient the nature of atherosclerosis, and emphasized the importance of maximal medical management including strict control of blood pressure, blood glucose, and lipid levels, antiplatelet agents, obtaining regular exercise, and cessation of smoking.    The patient is aware that without maximal medical management the underlying atherosclerotic disease process will progress, limiting the benefit of any interventions. The patient is currently  on a statin: Pravachol.  The patient is currently on an anti-platelet: Plavix.  Thank you for allowing us to participate in this patient's care.   Leonides SakeBrian Chen, MD, FACS [facility] [place] Office: [phone] Pager: [phone]

## 2017-01-03 ENCOUNTER — Encounter: Payer: Self-pay | Admitting: Vascular Surgery

## 2017-01-04 ENCOUNTER — Ambulatory Visit (HOSPITAL_COMMUNITY): Payer: Medicare Other | Attending: Vascular Surgery

## 2017-01-04 ENCOUNTER — Ambulatory Visit: Payer: Medicare Other | Admitting: Vascular Surgery
